# Patient Record
Sex: Female | Born: 1965 | Race: Black or African American | Hispanic: No | Marital: Single | Smoking: Never smoker
Health system: Southern US, Community
[De-identification: ages and names within clinical notes are randomized; demographics above are authoritative.]

## PROBLEM LIST (undated history)

## (undated) DIAGNOSIS — N3289 Other specified disorders of bladder: Secondary | ICD-10-CM

## (undated) DIAGNOSIS — D219 Benign neoplasm of connective and other soft tissue, unspecified: Secondary | ICD-10-CM

## (undated) DIAGNOSIS — K219 Gastro-esophageal reflux disease without esophagitis: Secondary | ICD-10-CM

## (undated) DIAGNOSIS — R350 Frequency of micturition: Secondary | ICD-10-CM

## (undated) DIAGNOSIS — N946 Dysmenorrhea, unspecified: Secondary | ICD-10-CM

## (undated) DIAGNOSIS — B379 Candidiasis, unspecified: Secondary | ICD-10-CM

## (undated) DIAGNOSIS — R351 Nocturia: Secondary | ICD-10-CM

## (undated) DIAGNOSIS — N920 Excessive and frequent menstruation with regular cycle: Secondary | ICD-10-CM

## (undated) DIAGNOSIS — Z8619 Personal history of other infectious and parasitic diseases: Secondary | ICD-10-CM

## (undated) DIAGNOSIS — Z87898 Personal history of other specified conditions: Secondary | ICD-10-CM

## (undated) DIAGNOSIS — N841 Polyp of cervix uteri: Secondary | ICD-10-CM

## (undated) DIAGNOSIS — F419 Anxiety disorder, unspecified: Secondary | ICD-10-CM

## (undated) HISTORY — DX: Personal history of other infectious and parasitic diseases: Z86.19

## (undated) HISTORY — DX: Personal history of other specified conditions: Z87.898

## (undated) HISTORY — DX: Frequency of micturition: R35.0

## (undated) HISTORY — DX: Candidiasis, unspecified: B37.9

## (undated) HISTORY — DX: Excessive and frequent menstruation with regular cycle: N92.0

## (undated) HISTORY — DX: Nocturia: R35.1

## (undated) HISTORY — DX: Other specified disorders of bladder: N32.89

## (undated) HISTORY — DX: Anxiety disorder, unspecified: F41.9

## (undated) HISTORY — DX: Benign neoplasm of connective and other soft tissue, unspecified: D21.9

## (undated) HISTORY — DX: Polyp of cervix uteri: N84.1

## (undated) HISTORY — DX: Dysmenorrhea, unspecified: N94.6

## (undated) SURGERY — Surgical Case
Anesthesia: *Unknown

---

## 2007-01-08 ENCOUNTER — Encounter: Admission: RE | Admit: 2007-01-08 | Discharge: 2007-01-08 | Payer: Self-pay | Admitting: Obstetrics and Gynecology

## 2008-04-12 ENCOUNTER — Encounter: Admission: RE | Admit: 2008-04-12 | Discharge: 2008-04-12 | Payer: Self-pay | Admitting: Obstetrics and Gynecology

## 2009-08-26 ENCOUNTER — Encounter: Admission: RE | Admit: 2009-08-26 | Discharge: 2009-08-26 | Payer: Self-pay | Admitting: Obstetrics and Gynecology

## 2010-08-29 ENCOUNTER — Encounter: Admission: RE | Admit: 2010-08-29 | Discharge: 2010-08-29 | Payer: Self-pay | Admitting: Obstetrics and Gynecology

## 2011-01-08 ENCOUNTER — Encounter: Payer: Self-pay | Admitting: Internal Medicine

## 2011-09-13 ENCOUNTER — Other Ambulatory Visit: Payer: Self-pay | Admitting: Obstetrics and Gynecology

## 2011-09-13 DIAGNOSIS — Z1231 Encounter for screening mammogram for malignant neoplasm of breast: Secondary | ICD-10-CM

## 2011-09-26 ENCOUNTER — Ambulatory Visit
Admission: RE | Admit: 2011-09-26 | Discharge: 2011-09-26 | Disposition: A | Discharge status: Home / Self Care | Payer: BC Managed Care – PPO | Source: Ambulatory Visit | Attending: Obstetrics and Gynecology | Admitting: Obstetrics and Gynecology

## 2011-09-26 DIAGNOSIS — Z1231 Encounter for screening mammogram for malignant neoplasm of breast: Secondary | ICD-10-CM

## 2012-05-15 ENCOUNTER — Telehealth: Payer: Self-pay | Admitting: Obstetrics and Gynecology

## 2012-05-15 NOTE — Telephone Encounter (Signed)
Triage/pt last seen on 10/04/2011

## 2012-05-16 ENCOUNTER — Telehealth: Payer: Self-pay

## 2012-05-16 NOTE — Telephone Encounter (Signed)
Pt was called back today and stated that she was told by Dr.Stringer the last time she spoke to him 09/2011  to call the office once she decided what she wanted to do as far as contraception. Pt states she does want to start Birth Control pills as well as PNV's she states that her co-workers take PNVs as well but she wants to check w/ Dr.Stringer first. [facility] CMA

## 2012-05-23 ENCOUNTER — Telehealth: Payer: Self-pay | Admitting: Obstetrics and Gynecology

## 2012-05-23 NOTE — Telephone Encounter (Signed)
Pt was called and appt has been scheduled for 06-09-12 @ 11am pt voiced understanding.

## 2012-06-09 ENCOUNTER — Encounter: Payer: Self-pay | Admitting: Obstetrics and Gynecology

## 2012-06-09 ENCOUNTER — Ambulatory Visit (INDEPENDENT_AMBULATORY_CARE_PROVIDER_SITE_OTHER): Payer: BC Managed Care – PPO | Admitting: Obstetrics and Gynecology

## 2012-06-09 VITALS — BP 108/72 | Resp 14 | Ht 65.5 in | Wt 138.0 lb

## 2012-06-09 DIAGNOSIS — Z309 Encounter for contraceptive management, unspecified: Secondary | ICD-10-CM

## 2012-06-09 DIAGNOSIS — R748 Abnormal levels of other serum enzymes: Secondary | ICD-10-CM | POA: Insufficient documentation

## 2012-06-09 DIAGNOSIS — N920 Excessive and frequent menstruation with regular cycle: Secondary | ICD-10-CM | POA: Insufficient documentation

## 2012-06-09 DIAGNOSIS — D219 Benign neoplasm of connective and other soft tissue, unspecified: Secondary | ICD-10-CM | POA: Insufficient documentation

## 2012-06-09 DIAGNOSIS — IMO0001 Reserved for inherently not codable concepts without codable children: Secondary | ICD-10-CM

## 2012-06-09 DIAGNOSIS — N3289 Other specified disorders of bladder: Secondary | ICD-10-CM | POA: Insufficient documentation

## 2012-06-09 DIAGNOSIS — R350 Frequency of micturition: Secondary | ICD-10-CM | POA: Insufficient documentation

## 2012-06-09 DIAGNOSIS — N946 Dysmenorrhea, unspecified: Secondary | ICD-10-CM

## 2012-06-09 NOTE — Progress Notes (Signed)
HISTORY OF PRESENT ILLNESS  [name] is a 46 y.o. year old female,G1P1, who presents for a problem visit. The patient has a known history of fibroids and menorrhagia.  She had a negative endometrial biopsy in 2010. She has declined other treatment options.  Subjective:  The patient complains of heavy bleeding.  She wants medication to help her cycles.  She was to consider birth control pills.  Objective:  BP 108/72  Resp 14  Ht 5' 5.5" (1.664 m)  Wt 138 lb (62.596 kg)  BMI 22.61 kg/m2  LMP 05/30/2012   General: alert, cooperative and no distress  exam declined  Assessment:  Menorrhagia Fibroid Endometrial polyps Elevated liver enzymes Unstable bladder  Plan:  We discussed our management options again.  Risk and benefits were discussed.  The patient seems most interested in a Nexplanon.  She will read the information provided and call us if she wants to schedule.  Return to office prn if symptoms worsen or fail to improve.   Leonard Schwartz M.D.  06/09/2012 8:41 PM

## 2012-09-17 ENCOUNTER — Other Ambulatory Visit: Payer: Self-pay | Admitting: Obstetrics and Gynecology

## 2012-09-17 DIAGNOSIS — Z1231 Encounter for screening mammogram for malignant neoplasm of breast: Secondary | ICD-10-CM

## 2012-10-16 ENCOUNTER — Ambulatory Visit
Admission: RE | Admit: 2012-10-16 | Discharge: 2012-10-16 | Disposition: A | Discharge status: Home / Self Care | Payer: BC Managed Care – PPO | Source: Ambulatory Visit | Attending: Obstetrics and Gynecology | Admitting: Obstetrics and Gynecology

## 2012-10-16 DIAGNOSIS — Z1231 Encounter for screening mammogram for malignant neoplasm of breast: Secondary | ICD-10-CM

## 2012-10-20 ENCOUNTER — Encounter: Payer: Self-pay | Admitting: Obstetrics and Gynecology

## 2012-10-21 ENCOUNTER — Ambulatory Visit (INDEPENDENT_AMBULATORY_CARE_PROVIDER_SITE_OTHER): Payer: BC Managed Care – PPO | Admitting: Obstetrics and Gynecology

## 2012-10-21 ENCOUNTER — Encounter: Payer: Self-pay | Admitting: Obstetrics and Gynecology

## 2012-10-21 VITALS — BP 122/76 | HR 88 | Ht 66.0 in | Wt 141.0 lb

## 2012-10-21 DIAGNOSIS — Z124 Encounter for screening for malignant neoplasm of cervix: Secondary | ICD-10-CM

## 2012-10-21 DIAGNOSIS — R350 Frequency of micturition: Secondary | ICD-10-CM

## 2012-10-21 DIAGNOSIS — Z01419 Encounter for gynecological examination (general) (routine) without abnormal findings: Secondary | ICD-10-CM

## 2012-10-21 LAB — POCT URINALYSIS DIPSTICK
Bilirubin, UA: NEGATIVE
Leukocytes, UA: NEGATIVE
Protein, UA: NEGATIVE
Spec Grav, UA: 1.025

## 2012-10-21 NOTE — Progress Notes (Signed)
Subjective:    Sheryl [name] is a 46 y.o. female, G1P1, who presents for an annual exam. The patient has a known history of fibroids.  She complains of menorrhagia.  She also has elevated liver enzymes, arthritis, and bladder instability.  She has been referred to a specialist for all the above.   History   Social History  . Marital Status: Single    Spouse Name: N/A    Number of Children: N/A  . Years of Education: N/A   Social History Main Topics  . Smoking status: Never Smoker   . Smokeless tobacco: Never Used  . Alcohol Use: Yes  . Drug Use: No  . Sexually Active: None   Other Topics Concern  . None   Social History Narrative  . None    Menstrual cycle:   LMP: Patient's last menstrual period was 10/10/2012.           Cycle: heavy most months.  The following portions of the patient's history were reviewed and updated as appropriate: allergies, current medications, past family history, past medical history, past social history, past surgical history and problem list.  Review of Systems Pertinent items are noted in HPI. Breast:Negative for breast lump,nipple discharge or nipple retraction Gastrointestinal: Negative for abdominal pain, change in bowel habits or rectal bleeding Urinary:negative   Objective:    BP 122/76  Pulse 88  Ht 5\' 6"  (1.676 m)  Wt 141 lb (63.957 kg)  BMI 22.76 kg/m2  LMP 10/10/2012    Weight:  Wt Readings from Last 1 Encounters:  10/21/12 141 lb (63.957 kg)          BMI: Body mass index is 22.76 kg/(m^2).  General Appearance: Alert, appropriate appearance for age. No acute distress HEENT: Grossly normal Neck / Thyroid: Supple, no masses, nodes or enlargement Lungs: clear to auscultation bilaterally Back: No CVA tenderness Breast Exam: No masses or nodes.No dimpling, nipple retraction or discharge. Cardiovascular: Regular rate and rhythm. S1, S2, no murmur Gastrointestinal: Soft, non-tender, no masses or  organomegaly  ++++++++++++++++++++++++++++++++++++++++++++++++++++++++  Pelvic Exam: External genitalia: normal general appearance Vaginal: normal without tenderness, induration or masses and relaxation noted, weak pelvic muscles Cervix: normal appearance Adnexa: normal bimanual exam Uterus: 12 week size, firm Rectovaginal: normal rectal, no masses  ++++++++++++++++++++++++++++++++++++++++++++++++++++++++  Lymphatic Exam: Non-palpable nodes in neck, clavicular, axillary, or inguinal regions Neurologic: Normal speech, no tremor  Psychiatric: Alert and oriented, appropriate affect.   Assessment:    fibroid uterus   Menorrhagia  Elevated liver enzymes  Arthritis  Bladder instability  Overweight or obese: No   Pelvic relaxation: Yes   Plan:    mammogram pap smear return annually or prn Contraception:abstinence   The patient will call in one month to discuss possible NovaSure ablation.  She is scheduled for liver biopsy and further evaluation.  The patient is scheduled to see a specialist about her bladder instability.  STD screen request: No   The updated Pap smear screening guidelines were discussed with the patient. The patient requested that I obtain a Pap smear: Yes.  Kegel exercises discussed: Yes.  Proper diet and regular exercise were reviewed.  Annual mammograms recommended starting at age 66. Proper breast care was discussed.  Screening colonoscopy is recommended beginning at age 74.  Regular health maintenance was reviewed.  Sleep hygiene was discussed.  Adequate calcium and vitamin D intake was emphasized.  Leonard Schwartz M.D.    Regular Periods: yes Mammogram: yes  Monthly Breast Ex.: yes Exercise: yes sometimes  Tetanus < 10 years: yes Seatbelts: yes  NI. Bladder Functn.: no sometimes frequency Abuse at home: no  Daily BM's: no Stressful Work: yes  Healthy Diet: yes Sigmoid-Colonoscopy: n/a  Calcium: no Medical problems this  year: arthritis, anemic, bladder problems, liver enzymes elevated, started seeing GI dr   LAST PAP: 06/13/09, pap due this yr per last note  Contraception: none  Mammogram:  10/16/12  PCP:  Dr. Dorothyann Peng  PMH: problems listed above  [facility]: unchanged  Last Bone Scan: n/a

## 2012-10-21 NOTE — Patient Instructions (Signed)

## 2012-10-23 LAB — PAP IG W/ RFLX HPV ASCU

## 2013-10-07 ENCOUNTER — Encounter (HOSPITAL_COMMUNITY): Payer: Self-pay | Admitting: Pharmacist

## 2013-10-12 ENCOUNTER — Other Ambulatory Visit: Payer: Self-pay | Admitting: Obstetrics and Gynecology

## 2013-10-13 NOTE — Patient Instructions (Addendum)
Your procedure is scheduled on: 10/28/2013  Enter through the Main Entrance of [facility] at: 7AM  Pick up the phone at the desk and dial [phone].  Call this number if you have problems the morning of surgery: [phone].  Remember: Do NOT eat food: AFTER MIDNIGHT  Do NOT drink clear liquids after:  AFTER MIDNIGHT  Take these medicines the morning of surgery with a SIP OF WATER: NONE  Do NOT wear jewelry (body piercing), make-up, or nail polish. Do NOT wear lotions, powders, or perfumes.  You may wear deoderant. Do NOT shave for 48 hours prior to surgery. Do NOT bring valuables to the hospital. Contacts, dentures, or bridgework may not be worn into surgery. Leave suitcase in car.  After surgery it may be brought to your room.  For patients admitted to the hospital, checkout time is 11:00 AM the day of discharge.

## 2013-10-14 ENCOUNTER — Encounter (HOSPITAL_COMMUNITY)
Admission: RE | Admit: 2013-10-14 | Discharge: 2013-10-14 | Disposition: A | Discharge status: Home / Self Care | Payer: BC Managed Care – PPO | Source: Ambulatory Visit | Attending: Obstetrics and Gynecology | Admitting: Obstetrics and Gynecology

## 2013-10-14 ENCOUNTER — Encounter (HOSPITAL_COMMUNITY): Payer: Self-pay

## 2013-10-14 ENCOUNTER — Encounter (INDEPENDENT_AMBULATORY_CARE_PROVIDER_SITE_OTHER): Payer: Self-pay

## 2013-10-14 DIAGNOSIS — Z01818 Encounter for other preprocedural examination: Secondary | ICD-10-CM | POA: Insufficient documentation

## 2013-10-14 DIAGNOSIS — Z01812 Encounter for preprocedural laboratory examination: Secondary | ICD-10-CM | POA: Insufficient documentation

## 2013-10-14 HISTORY — DX: Gastro-esophageal reflux disease without esophagitis: K21.9

## 2013-10-14 LAB — BASIC METABOLIC PANEL
BUN: 8 mg/dL (ref 6–23)
Calcium: 9.2 mg/dL (ref 8.4–10.5)
Chloride: 103 mEq/L (ref 96–112)
GFR calc Af Amer: >90 mL/min (ref >90)
GFR calc non Af Amer: >90 mL/min (ref >90)
Sodium: 138 mEq/L (ref 135–145)

## 2013-10-14 LAB — CBC
Hemoglobin: 11.3 g/dL — ABNORMAL LOW (ref 12.0–15.0)
MCHC: 32.1 g/dL (ref 30.0–36.0)
MCV: 90.3 fL (ref 78.0–100.0)
RBC: 3.9 MIL/uL (ref 3.87–5.11)
RDW: 14.7 % (ref 11.5–15.5)

## 2013-10-19 ENCOUNTER — Ambulatory Visit
Admission: RE | Admit: 2013-10-19 | Discharge: 2013-10-19 | Disposition: A | Discharge status: Home / Self Care | Payer: BC Managed Care – PPO | Source: Ambulatory Visit

## 2013-10-19 ENCOUNTER — Other Ambulatory Visit: Payer: Self-pay

## 2013-10-19 DIAGNOSIS — Z1231 Encounter for screening mammogram for malignant neoplasm of breast: Secondary | ICD-10-CM

## 2013-10-26 NOTE — H&P (Signed)
  Admission History and Physical Exam for a Gynecology Patient  Sheryl [name] is a 47 y.o. female, G1P1, who presents for abdominal myomectomy. She has been followed at the [facility] Gynecology division of [facility]. The patient complains of menorrhagia, anemia, dysmenorrhea, and she has a known history of fibroids. Her hemoglobin was documented to be 11.5. An endometrial biopsy was benign. Her Pap smear was within normal limits. An ultrasound showed a 15.3 x 9.7 cm multi-fibroid uterus with the largest fibroid measuring 10 cm. The patient does not want a hysterectomy.  OB History   Grav Para Term Preterm Abortions TAB SAB Ect Mult Living   1 1        1       Past Medical History  Diagnosis Date  . Unstable bladder   . Fibroids   . Menorrhagia   . Urinary frequency   . Endocervical polyp   . Dysmenorrhea   . Yeast infection   . History of chicken pox   . History of mumps   . History of measles   . Nocturia   . History of measles, mumps, or rubella   . H/O varicella   . GERD (gastroesophageal reflux disease)     diet controlled    No prescriptions prior to admission    Past Surgical History  Procedure Laterality Date  . No past surgeries      Allergies  Allergen Reactions  . Milk-Related Compounds     Family History: family history includes Cancer in her mother; Diabetes in her father and paternal grandfather; Hypertension in her mother; Mental illness in her father.  Social History:  reports that she has never smoked. She has never used smokeless tobacco. She reports that she drinks alcohol. She reports that she does not use illicit drugs.  Review of systems: See HPI.  Admission Physical Exam:    There is no weight on file to calculate BMI.  There were no vitals taken for this visit.  HEENT:                 Within normal limits Chest:                   Clear Heart:                    Regular rate and  rhythm Breasts:                No masses, skin changes, bleeding, or discharge present Abdomen:             Nontender, pelvic mass present Extremities:          Grossly normal Neurologic exam: Grossly normal  Pelvic exam:  External genitalia: normal general appearance Vaginal: normal without tenderness, induration or masses Cervix: normal appearance Adnexa: normal bimanual exam Uterus: 16 week size, irregular  Assessment:  16 week size multi-fibroid uterus  Anemia  Menorrhagia  Dysmenorrhea  Gastroesophageal reflux disease  Plan:  The patient will undergo an abdominal myomectomy. She understands the indications for her surgical procedure as well as the alternative treatment options. She accepts the risk of, but not limited to, anesthetic complications, bleeding, infections, and possible damage to the surrounding organs.   Janine Limbo 10/26/2013

## 2013-10-28 ENCOUNTER — Inpatient Hospital Stay (HOSPITAL_COMMUNITY)
Admission: RE | Admit: 2013-10-28 | Discharge: 2013-10-30 | DRG: 743 | Disposition: A | Discharge status: Home / Self Care | Payer: BC Managed Care – PPO | Source: Ambulatory Visit | Attending: Obstetrics and Gynecology | Admitting: Obstetrics and Gynecology

## 2013-10-28 ENCOUNTER — Encounter (HOSPITAL_COMMUNITY): Payer: Self-pay | Admitting: Registered Nurse

## 2013-10-28 ENCOUNTER — Encounter (HOSPITAL_COMMUNITY): Payer: BC Managed Care – PPO | Admitting: Anesthesiology

## 2013-10-28 ENCOUNTER — Inpatient Hospital Stay (HOSPITAL_COMMUNITY): Payer: BC Managed Care – PPO | Admitting: Anesthesiology

## 2013-10-28 ENCOUNTER — Encounter (HOSPITAL_COMMUNITY)
Admission: RE | Disposition: A | Discharge status: Home / Self Care | Payer: Self-pay | Source: Ambulatory Visit | Attending: Obstetrics and Gynecology

## 2013-10-28 DIAGNOSIS — Z9889 Other specified postprocedural states: Secondary | ICD-10-CM

## 2013-10-28 DIAGNOSIS — N946 Dysmenorrhea, unspecified: Secondary | ICD-10-CM | POA: Diagnosis present

## 2013-10-28 DIAGNOSIS — D259 Leiomyoma of uterus, unspecified: Principal | ICD-10-CM | POA: Diagnosis present

## 2013-10-28 DIAGNOSIS — K219 Gastro-esophageal reflux disease without esophagitis: Secondary | ICD-10-CM | POA: Diagnosis present

## 2013-10-28 DIAGNOSIS — N920 Excessive and frequent menstruation with regular cycle: Secondary | ICD-10-CM | POA: Diagnosis present

## 2013-10-28 DIAGNOSIS — N8000 Endometriosis of the uterus, unspecified: Secondary | ICD-10-CM | POA: Diagnosis present

## 2013-10-28 DIAGNOSIS — D649 Anemia, unspecified: Secondary | ICD-10-CM | POA: Diagnosis present

## 2013-10-28 HISTORY — PX: MYOMECTOMY: SHX85

## 2013-10-28 LAB — PREGNANCY, URINE: Preg Test, Ur: NEGATIVE

## 2013-10-28 LAB — TYPE AND SCREEN
ABO/RH(D): O POS
Antibody Screen: NEGATIVE

## 2013-10-28 SURGERY — MYOMECTOMY, ABDOMINAL APPROACH
Anesthesia: General | Site: Abdomen | Wound class: Clean Contaminated

## 2013-10-28 MED ORDER — ONDANSETRON HCL 4 MG/2ML IJ SOLN
INTRAMUSCULAR | Status: DC | PRN
  Administered 2013-10-28: 4 mg via INTRAVENOUS

## 2013-10-28 MED ORDER — VASOPRESSIN 20 UNIT/ML IJ SOLN
INTRAMUSCULAR | Status: AC
  Filled 2013-10-28: qty 1

## 2013-10-28 MED ORDER — LIDOCAINE HCL (CARDIAC) 20 MG/ML IV SOLN
INTRAVENOUS | Status: DC | PRN
  Administered 2013-10-28: 50 mg via INTRAVENOUS

## 2013-10-28 MED ORDER — 0.9 % SODIUM CHLORIDE (POUR BTL) OPTIME
TOPICAL | Status: DC | PRN
  Administered 2013-10-28: 1000 mL

## 2013-10-28 MED ORDER — KETOROLAC TROMETHAMINE 30 MG/ML IJ SOLN
INTRAMUSCULAR | Status: AC
  Filled 2013-10-28: qty 2

## 2013-10-28 MED ORDER — BUPIVACAINE-EPINEPHRINE 0.5% -1:200000 IJ SOLN
INTRAMUSCULAR | Status: DC | PRN
  Administered 2013-10-28: 10 mL

## 2013-10-28 MED ORDER — MIDAZOLAM HCL 5 MG/5ML IJ SOLN
INTRAMUSCULAR | Status: DC | PRN
  Administered 2013-10-28: 2 mg via INTRAVENOUS

## 2013-10-28 MED ORDER — CEFAZOLIN SODIUM-DEXTROSE 2-3 GM-% IV SOLR
INTRAVENOUS | Status: AC
  Filled 2013-10-28: qty 50

## 2013-10-28 MED ORDER — PROPOFOL 10 MG/ML IV EMUL
INTRAVENOUS | Status: AC
  Filled 2013-10-28: qty 20

## 2013-10-28 MED ORDER — NEOSTIGMINE METHYLSULFATE 1 MG/ML IJ SOLN
INTRAMUSCULAR | Status: DC | PRN
  Administered 2013-10-28: 4 mg via INTRAVENOUS

## 2013-10-28 MED ORDER — POLYSACCHARIDE IRON COMPLEX 150 MG PO CAPS
150.0000 mg | ORAL_CAPSULE | Freq: Every day | ORAL | Status: DC
  Administered 2013-10-28 – 2013-10-30 (×3): 150 mg via ORAL
  Filled 2013-10-28 (×3): qty 1

## 2013-10-28 MED ORDER — KETOROLAC TROMETHAMINE 30 MG/ML IJ SOLN
INTRAMUSCULAR | Status: DC | PRN
  Administered 2013-10-28: 30 mg via INTRAVENOUS
  Administered 2013-10-28: 30 mg via INTRAMUSCULAR

## 2013-10-28 MED ORDER — HYDROMORPHONE 0.3 MG/ML IV SOLN
INTRAVENOUS | Status: DC
  Administered 2013-10-28: 2 mL via INTRAVENOUS
  Administered 2013-10-28: 12:00:00 via INTRAVENOUS
  Administered 2013-10-28: 0.6 mg via INTRAVENOUS
  Administered 2013-10-28: 5 mL via INTRAVENOUS
  Administered 2013-10-29: 0.6 mg via INTRAVENOUS
  Administered 2013-10-29: 0.3 mg via INTRAVENOUS
  Filled 2013-10-28: qty 25

## 2013-10-28 MED ORDER — LIDOCAINE HCL (CARDIAC) 20 MG/ML IV SOLN
INTRAVENOUS | Status: AC
  Filled 2013-10-28: qty 5

## 2013-10-28 MED ORDER — ONDANSETRON HCL 4 MG/2ML IJ SOLN: 4.0000 mg | Freq: Four times a day (QID) | INTRAMUSCULAR | Status: DC | PRN

## 2013-10-28 MED ORDER — ROCURONIUM BROMIDE 100 MG/10ML IV SOLN
INTRAVENOUS | Status: AC
  Filled 2013-10-28: qty 1

## 2013-10-28 MED ORDER — PROMETHAZINE HCL 25 MG PO TABS: 12.5000 mg | ORAL_TABLET | Freq: Four times a day (QID) | ORAL | Status: DC | PRN

## 2013-10-28 MED ORDER — ACETAMINOPHEN 160 MG/5ML PO SOLN
ORAL | Status: AC
  Filled 2013-10-28: qty 40.6

## 2013-10-28 MED ORDER — HYDROMORPHONE HCL PF 1 MG/ML IJ SOLN
0.2500 mg | INTRAMUSCULAR | Status: DC | PRN
  Administered 2013-10-28 (×2): 0.5 mg via INTRAVENOUS

## 2013-10-28 MED ORDER — LACTATED RINGERS IV SOLN
INTRAVENOUS | Status: DC
  Administered 2013-10-28 (×3): via INTRAVENOUS

## 2013-10-28 MED ORDER — MEPERIDINE HCL 25 MG/ML IJ SOLN: 6.2500 mg | INTRAMUSCULAR | Status: DC | PRN

## 2013-10-28 MED ORDER — ROCURONIUM BROMIDE 100 MG/10ML IV SOLN
INTRAVENOUS | Status: DC | PRN
  Administered 2013-10-28: 60 mg via INTRAVENOUS

## 2013-10-28 MED ORDER — SODIUM CHLORIDE 0.9 % IJ SOLN
INTRAMUSCULAR | Status: AC
  Filled 2013-10-28: qty 100

## 2013-10-28 MED ORDER — GLYCOPYRROLATE 0.2 MG/ML IJ SOLN
INTRAMUSCULAR | Status: AC
  Filled 2013-10-28: qty 2

## 2013-10-28 MED ORDER — HEPARIN SODIUM (PORCINE) 5000 UNIT/ML IJ SOLN
INTRAMUSCULAR | Status: AC
  Filled 2013-10-28: qty 1

## 2013-10-28 MED ORDER — SODIUM CHLORIDE 0.9 % IJ SOLN: 9.0000 mL | INTRAMUSCULAR | Status: DC | PRN

## 2013-10-28 MED ORDER — FENTANYL CITRATE 0.05 MG/ML IJ SOLN
INTRAMUSCULAR | Status: DC | PRN
  Administered 2013-10-28: 150 ug via INTRAVENOUS
  Administered 2013-10-28 (×2): 50 ug via INTRAVENOUS

## 2013-10-28 MED ORDER — KETOROLAC TROMETHAMINE 30 MG/ML IJ SOLN: 15.0000 mg | Freq: Once | INTRAMUSCULAR | Status: DC | PRN

## 2013-10-28 MED ORDER — ONDANSETRON HCL 4 MG PO TABS: 4.0000 mg | ORAL_TABLET | Freq: Three times a day (TID) | ORAL | Status: DC | PRN

## 2013-10-28 MED ORDER — DIPHENHYDRAMINE HCL 12.5 MG/5ML PO ELIX: 12.5000 mg | ORAL_SOLUTION | Freq: Four times a day (QID) | ORAL | Status: DC | PRN

## 2013-10-28 MED ORDER — GLYCOPYRROLATE 0.2 MG/ML IJ SOLN
INTRAMUSCULAR | Status: AC
  Filled 2013-10-28: qty 1

## 2013-10-28 MED ORDER — MENTHOL 3 MG MT LOZG: 1.0000 | LOZENGE | OROMUCOSAL | Status: DC | PRN

## 2013-10-28 MED ORDER — NEOSTIGMINE METHYLSULFATE 1 MG/ML IJ SOLN
INTRAMUSCULAR | Status: AC
  Filled 2013-10-28: qty 1

## 2013-10-28 MED ORDER — DIPHENHYDRAMINE HCL 50 MG/ML IJ SOLN: 12.5000 mg | Freq: Four times a day (QID) | INTRAMUSCULAR | Status: DC | PRN

## 2013-10-28 MED ORDER — CEFAZOLIN SODIUM-DEXTROSE 2-3 GM-% IV SOLR
2.0000 g | INTRAVENOUS | Status: AC
  Administered 2013-10-28: 2 g via INTRAVENOUS

## 2013-10-28 MED ORDER — ONDANSETRON HCL 4 MG/2ML IJ SOLN
INTRAMUSCULAR | Status: AC
  Filled 2013-10-28: qty 2

## 2013-10-28 MED ORDER — PROPOFOL 10 MG/ML IV BOLUS
INTRAVENOUS | Status: DC | PRN
  Administered 2013-10-28: 200 mg via INTRAVENOUS

## 2013-10-28 MED ORDER — OXYCODONE-ACETAMINOPHEN 5-325 MG PO TABS
1.0000 | ORAL_TABLET | ORAL | Status: DC | PRN
  Administered 2013-10-29 (×2): 2 via ORAL
  Administered 2013-10-30 (×3): 1 via ORAL
  Filled 2013-10-28 (×2): qty 1
  Filled 2013-10-28 (×2): qty 2
  Filled 2013-10-28: qty 1

## 2013-10-28 MED ORDER — FENTANYL CITRATE 0.05 MG/ML IJ SOLN
INTRAMUSCULAR | Status: AC
  Filled 2013-10-28: qty 5

## 2013-10-28 MED ORDER — NALOXONE HCL 0.4 MG/ML IJ SOLN: 0.4000 mg | INTRAMUSCULAR | Status: DC | PRN

## 2013-10-28 MED ORDER — VASOPRESSIN 20 UNIT/ML IJ SOLN
INTRAVENOUS | Status: DC | PRN
  Administered 2013-10-28: 10:00:00 via INTRAMUSCULAR

## 2013-10-28 MED ORDER — PROMETHAZINE HCL 25 MG/ML IJ SOLN: 12.5000 mg | Freq: Four times a day (QID) | INTRAMUSCULAR | Status: DC | PRN

## 2013-10-28 MED ORDER — HYDROMORPHONE HCL PF 1 MG/ML IJ SOLN
INTRAMUSCULAR | Status: AC
  Administered 2013-10-28: 0.5 mg via INTRAVENOUS
  Filled 2013-10-28: qty 1

## 2013-10-28 MED ORDER — ACETAMINOPHEN 160 MG/5ML PO SOLN
975.0000 mg | Freq: Once | ORAL | Status: AC
  Administered 2013-10-28: 975 mg via ORAL

## 2013-10-28 MED ORDER — DEXAMETHASONE SODIUM PHOSPHATE 10 MG/ML IJ SOLN
INTRAMUSCULAR | Status: AC
  Filled 2013-10-28: qty 1

## 2013-10-28 MED ORDER — KETOROLAC TROMETHAMINE 30 MG/ML IJ SOLN
30.0000 mg | Freq: Four times a day (QID) | INTRAMUSCULAR | Status: DC
  Administered 2013-10-28 – 2013-10-29 (×5): 30 mg via INTRAVENOUS
  Filled 2013-10-28 (×5): qty 1

## 2013-10-28 MED ORDER — DEXAMETHASONE SODIUM PHOSPHATE 10 MG/ML IJ SOLN
INTRAMUSCULAR | Status: DC | PRN
  Administered 2013-10-28: 10 mg via INTRAVENOUS

## 2013-10-28 MED ORDER — BUPIVACAINE-EPINEPHRINE (PF) 0.5% -1:200000 IJ SOLN
INTRAMUSCULAR | Status: AC
  Filled 2013-10-28: qty 10

## 2013-10-28 MED ORDER — LACTATED RINGERS IV SOLN
INTRAVENOUS | Status: DC
  Administered 2013-10-28 – 2013-10-29 (×3): via INTRAVENOUS

## 2013-10-28 MED ORDER — MIDAZOLAM HCL 2 MG/2ML IJ SOLN
INTRAMUSCULAR | Status: AC
  Filled 2013-10-28: qty 2

## 2013-10-28 MED ORDER — METOCLOPRAMIDE HCL 5 MG/ML IJ SOLN: 10.0000 mg | Freq: Once | INTRAMUSCULAR | Status: DC | PRN

## 2013-10-28 MED ORDER — IBUPROFEN 600 MG PO TABS: 600.0000 mg | ORAL_TABLET | Freq: Four times a day (QID) | ORAL | Status: DC

## 2013-10-28 MED ORDER — GLYCOPYRROLATE 0.2 MG/ML IJ SOLN
INTRAMUSCULAR | Status: DC | PRN
  Administered 2013-10-28: 0.6 mg via INTRAVENOUS

## 2013-10-28 MED ORDER — INTEGRA PLUS PO CAPS: 1.0000 | ORAL_CAPSULE | Freq: Every day | ORAL | Status: DC

## 2013-10-28 SURGICAL SUPPLY — 50 items
BARRIER ADHS 3X4 INTERCEED (GAUZE/BANDAGES/DRESSINGS) ×2 IMPLANT
BRR ADH 4X3 ABS CNTRL BYND (GAUZE/BANDAGES/DRESSINGS) ×1
CANISTER SUCT 3000ML (MISCELLANEOUS) ×2 IMPLANT
CELLS DAT CNTRL 66122 CELL SVR (MISCELLANEOUS) ×1 IMPLANT
CHLORAPREP W/TINT 26ML (MISCELLANEOUS) ×2 IMPLANT
CLOTH BEACON ORANGE TIMEOUT ST (SAFETY) ×2 IMPLANT
CONT PATH 16OZ SNAP LID 3702 (MISCELLANEOUS) ×2 IMPLANT
DECANTER SPIKE VIAL GLASS SM (MISCELLANEOUS) ×6 IMPLANT
DRAIN JACKSON PRT FLT 7MM (DRAIN) IMPLANT
DRAPE CESAREAN BIRTH W POUCH (DRAPES) ×2 IMPLANT
DRESSING TELFA 8X3 (GAUZE/BANDAGES/DRESSINGS) ×2 IMPLANT
ELECT CAUTERY BLADE 6.4 (BLADE) IMPLANT
EVACUATOR SILICONE 100CC (DRAIN) IMPLANT
GAUZE SPONGE 4X4 16PLY XRAY LF (GAUZE/BANDAGES/DRESSINGS) ×2 IMPLANT
GLOVE BIOGEL PI IND STRL 8.5 (GLOVE) ×1 IMPLANT
GLOVE BIOGEL PI INDICATOR 8.5 (GLOVE) ×1
GLOVE ECLIPSE 8.0 STRL XLNG CF (GLOVE) ×4 IMPLANT
GOWN PREVENTION PLUS LG XLONG (DISPOSABLE) ×4 IMPLANT
GOWN PREVENTION PLUS XXLARGE (GOWN DISPOSABLE) ×2 IMPLANT
NEEDLE HYPO 25X1 1.5 SAFETY (NEEDLE) ×2 IMPLANT
NS IRRIG 1000ML POUR BTL (IV SOLUTION) ×2 IMPLANT
PACK ABDOMINAL GYN (CUSTOM PROCEDURE TRAY) ×2 IMPLANT
PAD ABD 7.5X8 STRL (GAUZE/BANDAGES/DRESSINGS) ×2 IMPLANT
PAD OB MATERNITY 4.3X12.25 (PERSONAL CARE ITEMS) ×2 IMPLANT
RINGERS IRRIG 1000ML POUR BTL (IV SOLUTION) ×2 IMPLANT
RTRCTR WOUND ALEXIS 18CM MED (MISCELLANEOUS) ×2
SPONGE GAUZE 4X4 12PLY (GAUZE/BANDAGES/DRESSINGS) ×2 IMPLANT
STAPLER VISISTAT 35W (STAPLE) ×2 IMPLANT
SUT MNCRL AB 3-0 PS2 27 (SUTURE) IMPLANT
SUT PDS AB 0 CT 36 (SUTURE) IMPLANT
SUT VIC AB 0 CT1 18XCR BRD8 (SUTURE) ×2 IMPLANT
SUT VIC AB 0 CT1 27 (SUTURE) ×4
SUT VIC AB 0 CT1 27XBRD ANBCTR (SUTURE) ×2 IMPLANT
SUT VIC AB 0 CT1 8-18 (SUTURE) ×4
SUT VIC AB 0 CTX 36 (SUTURE) ×2
SUT VIC AB 0 CTX36XBRD ANBCTRL (SUTURE) ×1 IMPLANT
SUT VIC AB 2-0 CT1 27 (SUTURE)
SUT VIC AB 2-0 CT1 TAPERPNT 27 (SUTURE) IMPLANT
SUT VIC AB 3-0 CT1 27 (SUTURE) ×2
SUT VIC AB 3-0 CT1 TAPERPNT 27 (SUTURE) ×1 IMPLANT
SUT VIC AB 3-0 SH 27 (SUTURE) ×6
SUT VIC AB 3-0 SH 27X BRD (SUTURE) ×3 IMPLANT
SUT VIC AB 4-0 SH 27 (SUTURE)
SUT VIC AB 4-0 SH 27XANBCTRL (SUTURE) IMPLANT
SUT VICRYL 0 TIES 12 18 (SUTURE) ×2 IMPLANT
SYR CONTROL 10ML LL (SYRINGE) ×2 IMPLANT
TAPE CLOTH SURG 4X10 WHT LF (GAUZE/BANDAGES/DRESSINGS) ×2 IMPLANT
TOWEL OR 17X24 6PK STRL BLUE (TOWEL DISPOSABLE) ×4 IMPLANT
TRAY FOLEY CATH 14FR (SET/KITS/TRAYS/PACK) ×2 IMPLANT
WATER STERILE IRR 1000ML POUR (IV SOLUTION) IMPLANT

## 2013-10-28 NOTE — Preoperative (Signed)
Beta Blockers   Reason not to administer Beta Blockers:Not Applicable 

## 2013-10-28 NOTE — Anesthesia Preprocedure Evaluation (Addendum)
Anesthesia Evaluation  Patient identified by MRN, date of birth, ID band Patient awake    Reviewed: Allergy & Precautions, H&P , NPO status , Patient's Chart, lab work & pertinent test results, reviewed documented beta blocker date and time   History of Anesthesia Complications Negative for: history of anesthetic complications  Airway Mallampati: II TM Distance: >3 FB Neck ROM: full    Dental no notable dental hx. (+) Teeth Intact   Pulmonary neg pulmonary ROS,  breath sounds clear to auscultation  Pulmonary exam normal       Cardiovascular Exercise Tolerance: Good negative cardio ROS  Rhythm:regular Rate:Normal     Neuro/Psych negative neurological ROS  negative psych ROS   GI/Hepatic Neg liver ROS, GERD- (diet-controlled)  ,  Endo/Other  negative endocrine ROS  Renal/GU negative Renal ROS Bladder dysfunction Female GU complaint     Musculoskeletal   Abdominal Normal abdominal exam  (+)   Peds  Hematology  (+) anemia ,   Anesthesia Other Findings   Reproductive/Obstetrics negative OB ROS                          Anesthesia Physical Anesthesia Plan  ASA: II  Anesthesia Plan: General ETT   Post-op Pain Management:    Induction:   Airway Management Planned:   Additional Equipment:   Intra-op Plan:   Post-operative Plan:   Informed Consent: I have reviewed the patients History and Physical, chart, labs and discussed the procedure including the risks, benefits and alternatives for the proposed anesthesia with the patient or authorized representative who has indicated his/her understanding and acceptance.   Dental Advisory Given  Plan Discussed with: CRNA and Surgeon  Anesthesia Plan Comments:         Anesthesia Quick Evaluation

## 2013-10-28 NOTE — H&P (Signed)
The patient was interviewed and examined today.  The previously documented history and physical examination was reviewed. There are no changes. The operative procedure was reviewed. The risks and benefits were outlined again. The specific risks include, but are not limited to, anesthetic complications, bleeding, infections, and possible damage to the surrounding organs. The patient's questions were answered.  We are ready to proceed as outlined. The likelihood of the patient achieving the goals of this procedure is very likely.   BP 141/91  Pulse 96  Temp(Src) 98.6 F (37 C) (Oral)  Resp 16  Ht 5\' 6"  (1.676 m)  Wt 152 lb (68.947 kg)  BMI 24.55 kg/m2  SpO2 100%  Results for orders placed during the hospital encounter of 10/28/13 (from the past 24 hour(s))  PREGNANCY, URINE     Status: None   Collection Time    10/28/13  7:00 AM      Result Value Range   Preg Test, Ur NEGATIVE  NEGATIVE  TYPE AND SCREEN     Status: None   Collection Time    10/28/13  7:10 AM      Result Value Range   ABO/RH(D) O POS     Antibody Screen NEG     Sample Expiration 10/31/2013       Leonard Schwartz, M.D.

## 2013-10-28 NOTE — Anesthesia Postprocedure Evaluation (Signed)
  Anesthesia Post-op Note  Patient: Sheryl [name]  Procedure(s) Performed: Procedure(s): MYOMECTOMY (N/A) Patient is awake and responsive. Pain and nausea are reasonably well controlled. Vital signs are stable and clinically acceptable. Oxygen saturation is clinically acceptable. There are no apparent anesthetic complications at this time. Patient is ready for discharge.

## 2013-10-28 NOTE — Transfer of Care (Signed)
Immediate Anesthesia Transfer of Care Note  Patient: Sheryl [name]  Procedure(s) Performed: Procedure(s): MYOMECTOMY (N/A)  Patient Location: PACU  Anesthesia Type:General  Level of Consciousness: sedated  Airway & Oxygen Therapy: Patient Spontanous Breathing and Patient connected to nasal cannula oxygen  Post-op Assessment: Report given to PACU RN  Post vital signs: Reviewed  Complications: No apparent anesthesia complications

## 2013-10-28 NOTE — Progress Notes (Signed)
Ur chart review completed.  

## 2013-10-28 NOTE — Progress Notes (Signed)
Day of Surgery Procedure(s) (LRB): MYOMECTOMY (N/A)  Day 0  Subjective: Patient reports tolerating PO.    Objective: I have reviewed patient's vital signs and intake and output.  General: alert and no distress Resp: clear to auscultation bilaterally Cardio: regular rate and rhythm, S1, S2 normal, no murmur, click, rub or gallop GI: soft and nontender Extremities: Homans sign is negative, no sign of DVT  Assessment: s/p Procedure(s): MYOMECTOMY: stable  Plan: Encourage ambulation Advance to PO medication  LOS: 0 days    Betzaida Cremeens V 10/28/2013, 6:41 PM

## 2013-10-28 NOTE — Anesthesia Postprocedure Evaluation (Signed)
Anesthesia Post Note  Patient: Sheryl [name]  Procedure(s) Performed: Procedure(s): MYOMECTOMY (N/A)  Anesthesia type: General  Patient location: [facility] Unit  Post pain: Pain level controlled  Post assessment: Post-op Vital signs reviewed  Last Vitals: BP 115/74  Pulse 81  Temp(Src) 36.6 C (Oral)  Resp 8  Ht 5\' 6"  (1.676 m)  Wt 152 lb (68.947 kg)  BMI 24.55 kg/m2  SpO2 98%  Post vital signs: Reviewed  Level of consciousness: awake  Complications: No apparent anesthesia complications

## 2013-10-28 NOTE — Op Note (Signed)
OPERATIVE NOTE  Sheryl [name]  DOB:    1966-10-23  MRN:    [id number]  CSN:    [id number]  Date of Surgery:  10/28/2013  Preoperative Diagnosis:  Menorrhagia  Anemia  Fibroid uterus  Dysmenorrhea  Postoperative Diagnosis:  Same  Probable adenomyosis  Procedure:  Multiple abdominal myomectomy  Surgeon:  Leonard Schwartz, M.D.  Assistant:  Henreitta Leber, PA-C  Anesthetic:  General  Disposition:  The patient presents with the above-mentioned diagnosis. She understands the indications for surgical procedure. She accepts the risk of, but not limited to, anesthetic complications, bleeding, infections, and possible damage to the surrounding organs.  Findings:  A total of 6 fibroids were removed. The largest fibroid measured approximately 8 cm in size. This largest mass may represent adenomyosis or a degenerating fibroid. The total weight was 314 g. The fallopian tubes and the ovaries appeared normal. No other pelvic abnormalities were appreciated. We did penetrate deeply into the myometrium. If a pregnancy should occur in the future, then I do recommend a cesarean delivery.  Procedure:  The patient was taken to the operating room where a general anesthetic was given. A timeout was performed confirming the appropriate procedure. The patient's abdomen was prepped with DuraPrep. Her perineum and vagina were prepped with Betadine. A Foley catheter was placed in the bladder. The patient was sterilely draped. The patient's lower abdomen was injected with 10 cc of half percent Marcaine with epinephrine. A low transverse incision was made in the lower abdomen and carried sharply through the subcutaneous tissue, the fascia, and the anterior peritoneum. The pelvis was explored with findings as mentioned above. The uterus was elevated into the operative field. The surface of the uterus was injected with a diluted solution of Pitressin and saline just above the largest  fibroid. Pictures were taken. The fibroid was removed from the myometrium using a combination of sharp and blunt dissection. This incision was at the fundus of the uterus. We did penetrate deeply into the myometrium. A second fibroid was removed from deep within this incision. The defect in the uterus was closed using deep sutures followed by a running superficial suture and the serosal surface of the uterus. Hemostasis was adequate. A pedunculated fibroid was present on the posterior surface of the uterus. The base of the fibroid was injected with Pitressin and saline. The fibroid was sharply removed. The serosal surface of the uterus was suture using 3-0 Vicryl. 3 small fibroids were removed from the posterior surface of the uterus. The serosal surface of the uterus was closed using 3-0 Vicryl. The pelvis was irrigated. Hemostasis was confirmed. Interceed was placed over the uterine incisions. All instruments and packs were removed from the abdominal cavity. The anterior peritoneum and the abdominal musculature were reapproximated in the midline using figure-of-eight sutures. The abdominal musculature and the fascia were irrigated. Hemostasis was adequate. The fascia was closed using a running suture followed by several interrupted sutures. The subcutaneous layer was closed using interrupted sutures. The skin was reapproximated using a subcuticular suture of 3-0 Monocryl. 0 Vicryl is the suture material used except where otherwise mentioned.  Sponge, needle, and instrument counts were correct on 2 occasions. The estimated blood loss for the procedure was 500 cc's. The patient tolerated her procedure well. She was awakened from her anesthetic without difficulty. She was transported to the recovery room in stable condition. She was noted to drain clear yellow urine. Pathology specimens included: 6 fibroids.   Leonard Schwartz, M.D.  10/28/2013

## 2013-10-29 ENCOUNTER — Encounter (HOSPITAL_COMMUNITY): Payer: Self-pay | Admitting: Obstetrics and Gynecology

## 2013-10-29 DIAGNOSIS — Z9889 Other specified postprocedural states: Secondary | ICD-10-CM

## 2013-10-29 LAB — CBC
HCT: 27.3 % — ABNORMAL LOW (ref 36.0–46.0)
Hemoglobin: 9.1 g/dL — ABNORMAL LOW (ref 12.0–15.0)
MCHC: 33.3 g/dL (ref 30.0–36.0)
RBC: 3 MIL/uL — ABNORMAL LOW (ref 3.87–5.11)

## 2013-10-29 MED ORDER — IBUPROFEN 600 MG PO TABS
600.0000 mg | ORAL_TABLET | Freq: Four times a day (QID) | ORAL | Status: DC
  Administered 2013-10-29 – 2013-10-30 (×3): 600 mg via ORAL
  Filled 2013-10-29 (×3): qty 1

## 2013-10-29 NOTE — Progress Notes (Signed)
PROGRESS NOTE  I have reviewed the patient's vital signs, labs, and notes. I have examined the patient. I agree with the previous note from the physician assistant.  Leonard Schwartz, M.D. 10/29/2013

## 2013-10-29 NOTE — Progress Notes (Signed)
Sheryl [name] is a36 y.o.  [id number]  Post Op Date # 1: Abdominal Myomectomy  Subjective: Patient is Doing well postoperatively. Patient has Pain is controlled with current analgesics. Medications being used: prescription NSAID's including Toradol and narcotic analgesics including Hydromorphone-PCA.., ambulated last evening without dizziness, tolerating liquids but hasn't voided yet nor passed flatus.  Objective: Vital signs in last 24 hours: Temp:  [97.3 F (36.3 C)-98.7 F (37.1 C)] 98.7 F (37.1 C) (11/13 0513) Pulse Rate:  [67-88] 86 (11/13 0520) Resp:  [8-18] 16 (11/13 0513) BP: (103-120)/(58-81) 116/81 mmHg (11/13 0520) SpO2:  [98 %-100 %] 98 % (11/13 0513)  Intake/Output from previous day: 11/12 0701 - 11/13 0700 In: 4691.1 [P.O.:340; I.V.:4351.1] Out: 2925 [Urine:2425] Intake/Output this shift:    Recent Labs Lab 10/29/13 0510  WBC 12.6*  HGB 9.1*  HCT 27.3*  PLT 212    No results found for this basename: NA, K, CL, CO2, BUN, CREATININE, CALCIUM, LABALBU, PROT, BILITOT, ALKPHOS, ALT, AST, GLUCOSE,  in the last 168 hours  EXAM: General: alert, cooperative and no distress Resp: clear to auscultation bilaterally Cardio: regular rate and rhythm, S1, S2 normal, no murmur, click, rub or gallop GI: Soft, bowel sounds present, dressing clean/dry/intact. Extremities: Homans sign is negative, no sign of DVT and no calf tenderness.   Assessment: s/p Procedure(s): MYOMECTOMY: stable, progressing well and anemia  Plan: Advance diet Encourage ambulation Advance to PO medication Routine care.  LOS: 1 day    Kelsy Polack, PA-C 10/29/2013 7:27 AM

## 2013-10-30 MED ORDER — INTEGRA PLUS PO CAPS: 1.0000 | ORAL_CAPSULE | Freq: Two times a day (BID) | ORAL | Status: DC

## 2013-10-30 MED ORDER — OXYCODONE-ACETAMINOPHEN 5-325 MG PO TABS: 1.0000 | ORAL_TABLET | ORAL | Status: DC | PRN

## 2013-10-30 MED ORDER — IBUPROFEN 600 MG PO TABS: ORAL_TABLET | ORAL | Status: AC

## 2013-10-30 MED ORDER — ONDANSETRON HCL 4 MG PO TABS: 4.0000 mg | ORAL_TABLET | Freq: Three times a day (TID) | ORAL | Status: DC | PRN

## 2013-10-30 NOTE — Progress Notes (Signed)
Sheryl [name] is a32 y.o.  [id number]  Post Op Date # 2: Abdominal Myomectomy  Subjective: Patient is Doing well postoperatively. Patient has Pain controlled with oral narcotic and NSAIDs. Tolerating regular diet, ambulating without difficulty and voiding without difficulty.  Objective: Vital signs in last 24 hours: Temp:  [98.1 F (36.7 C)-98.8 F (37.1 C)] 98.8 F (37.1 C) (11/14 0512) Pulse Rate:  [78-86] 78 (11/14 0512) Resp:  [16-18] 16 (11/14 0512) BP: (105-122)/(67-78) 122/78 mmHg (11/14 0512) SpO2:  [96 %-99 %] 98 % (11/14 0512)  Intake/Output from previous day: 11/13 0701 - 11/14 0700 In: 1080 [P.O.:1080] Out: 400 [Urine:400] Intake/Output this shift:    Recent Labs Lab 10/29/13 0510  WBC 12.6*  HGB 9.1*  HCT 27.3*  PLT 212    No results found for this basename: NA, K, CL, CO2, BUN, CREATININE, CALCIUM, LABALBU, PROT, BILITOT, ALKPHOS, ALT, AST, GLUCOSE,  in the last 168 hours  EXAM: General: alert, cooperative and no distress Resp: clear to auscultation bilaterally Cardio: regular rate and rhythm, S1, S2 normal, no murmur, click, rub or gallop GI: Soft, bowel sound present Extremities: Homans sign is negative, no sign of DVT and no calf tenderness Vaginal Bleeding: scant Incision: good wound edge approximation with no evidence of infection.   Assessment: s/p Procedure(s): MYOMECTOMY: stable, progressing well and anemia  Plan: Discharge home  LOS: 2 days    Sheryl Guitron, PA-C 10/30/2013 6:52 AM

## 2013-10-30 NOTE — Discharge Summary (Signed)
Physician Discharge Summary  Patient ID: Sheryl [name] MRN: [id number] DOB/AGE: [birth year] 47 y.o.  Admit date: 10/28/2013 Discharge date: 10/30/2013   Discharge Diagnoses:  Symptomatic Uterine Fibroids,  Menorrhagia,  Dysmenorrhea and Anemia Principal Problem:   S/P myomectomy   Operation: Abdominal Myomectomy   Discharged Condition:  Good   Hospital Course: On the date of admission the patient underwent the aforementioned procedure with the removal of #6 fibroids, the largest of which was 8 cm (degenerating fibroid vs adenomyosis), tolerating procedure well.  Post operative course was unremarkable with the patient tolerating a post operative hemoglobin of 9.1  (pre-operative hemoglobin, 11.3).  By post operative day #2 the patient had resumed bowel and bladder function and was  deemed ready for discharge home.  Disposition: Home to self care  Discharge Medications:    Medication List    STOP taking these medications       naproxen sodium 220 MG tablet  Commonly known as:  ANAPROX     prenatal multivitamin Tabs tablet      TAKE these medications       CALCARB 600/D 600-400 MG-UNIT per tablet  Generic drug:  Calcium Carbonate-Vitamin D  Take 1 tablet by mouth daily.     ibuprofen 600 MG tablet  Commonly known as:  ADVIL,MOTRIN  1 po pc q 6 hours x 5 days then prn-pain     ondansetron 4 MG tablet  Commonly known as:  ZOFRAN  Take 1 tablet (4 mg total) by mouth every 8 (eight) hours as needed for nausea or vomiting.     oxyCODONE-acetaminophen 5-325 MG per tablet  Commonly known as:  PERCOCET/ROXICET  Take 1-2 tablets by mouth every 4 (four) hours as needed for severe pain (moderate to severe pain (when tolerating fluids)).      ASK your doctor about these medications       acetaminophen 500 MG tablet  Commonly known as:  TYLENOL  Take 1,000 mg by mouth daily as needed (headache).     INTEGRA PLUS PO  Take 1 capsule by mouth daily.  Ask about: Which  instructions should I use?     INTEGRA PLUS Caps  Take 1 capsule by mouth 2 (two) times daily after a meal.  Ask about: Which instructions should I use?          Follow-up: Dr. Janine Limbo,  December 03, 2013 at 10:15 a.m.   SignedHenreitta Leber, PA-C 10/30/2013, 7:08 AM

## 2013-10-30 NOTE — Progress Notes (Signed)
PROGRESS NOTE  I have reviewed the patient's vital signs, labs, and notes. I have examined the patient. I agree with the previous note from the physician assistant.  Leonard Schwartz, M.D. 10/30/2013

## 2014-10-18 ENCOUNTER — Encounter (HOSPITAL_COMMUNITY): Payer: Self-pay | Admitting: Obstetrics and Gynecology

## 2014-11-01 ENCOUNTER — Other Ambulatory Visit: Payer: Self-pay

## 2014-11-01 DIAGNOSIS — Z1231 Encounter for screening mammogram for malignant neoplasm of breast: Secondary | ICD-10-CM

## 2014-11-23 ENCOUNTER — Ambulatory Visit
Admission: RE | Admit: 2014-11-23 | Discharge: 2014-11-23 | Disposition: A | Discharge status: Home / Self Care | Payer: BC Managed Care – PPO | Source: Ambulatory Visit

## 2014-11-23 DIAGNOSIS — Z1231 Encounter for screening mammogram for malignant neoplasm of breast: Secondary | ICD-10-CM

## 2016-01-04 ENCOUNTER — Other Ambulatory Visit: Payer: Self-pay

## 2016-01-04 DIAGNOSIS — Z1231 Encounter for screening mammogram for malignant neoplasm of breast: Secondary | ICD-10-CM

## 2016-01-16 ENCOUNTER — Ambulatory Visit
Admission: RE | Admit: 2016-01-16 | Discharge: 2016-01-16 | Disposition: A | Discharge status: Home / Self Care | Payer: BLUE CROSS/BLUE SHIELD | Source: Ambulatory Visit

## 2016-01-16 DIAGNOSIS — Z1231 Encounter for screening mammogram for malignant neoplasm of breast: Secondary | ICD-10-CM

## 2017-02-18 LAB — HM COLONOSCOPY

## 2017-02-19 ENCOUNTER — Other Ambulatory Visit: Payer: Self-pay | Admitting: Obstetrics and Gynecology

## 2017-02-19 DIAGNOSIS — Z1231 Encounter for screening mammogram for malignant neoplasm of breast: Secondary | ICD-10-CM

## 2017-02-20 ENCOUNTER — Ambulatory Visit
Admission: RE | Admit: 2017-02-20 | Discharge: 2017-02-20 | Disposition: A | Discharge status: Home / Self Care | Payer: BLUE CROSS/BLUE SHIELD | Source: Ambulatory Visit | Attending: Obstetrics and Gynecology | Admitting: Obstetrics and Gynecology

## 2017-02-20 DIAGNOSIS — Z1231 Encounter for screening mammogram for malignant neoplasm of breast: Secondary | ICD-10-CM

## 2017-03-05 ENCOUNTER — Other Ambulatory Visit: Payer: Self-pay | Admitting: Internal Medicine

## 2017-03-05 DIAGNOSIS — R7989 Other specified abnormal findings of blood chemistry: Secondary | ICD-10-CM

## 2017-03-05 DIAGNOSIS — R945 Abnormal results of liver function studies: Secondary | ICD-10-CM

## 2017-03-15 ENCOUNTER — Ambulatory Visit
Admission: RE | Admit: 2017-03-15 | Discharge: 2017-03-15 | Disposition: A | Discharge status: Home / Self Care | Payer: BLUE CROSS/BLUE SHIELD | Source: Ambulatory Visit | Attending: Internal Medicine | Admitting: Internal Medicine

## 2017-03-15 DIAGNOSIS — R7989 Other specified abnormal findings of blood chemistry: Secondary | ICD-10-CM

## 2017-03-15 DIAGNOSIS — R945 Abnormal results of liver function studies: Secondary | ICD-10-CM

## 2018-02-11 ENCOUNTER — Other Ambulatory Visit: Payer: Self-pay | Admitting: Obstetrics and Gynecology

## 2018-02-11 DIAGNOSIS — Z1231 Encounter for screening mammogram for malignant neoplasm of breast: Secondary | ICD-10-CM

## 2018-03-03 ENCOUNTER — Ambulatory Visit
Admission: RE | Admit: 2018-03-03 | Discharge: 2018-03-03 | Disposition: A | Discharge status: Home / Self Care | Payer: BLUE CROSS/BLUE SHIELD | Source: Ambulatory Visit | Attending: Obstetrics and Gynecology | Admitting: Obstetrics and Gynecology

## 2018-03-03 DIAGNOSIS — Z1231 Encounter for screening mammogram for malignant neoplasm of breast: Secondary | ICD-10-CM

## 2018-05-07 ENCOUNTER — Ambulatory Visit: Payer: BLUE CROSS/BLUE SHIELD | Admitting: Allergy

## 2018-05-07 ENCOUNTER — Encounter: Payer: Self-pay | Admitting: Allergy

## 2018-05-07 VITALS — BP 132/74 | HR 80 | Temp 98.7°F | Resp 18 | Ht 65.0 in | Wt 168.0 lb

## 2018-05-07 DIAGNOSIS — H1010 Acute atopic conjunctivitis, unspecified eye: Secondary | ICD-10-CM | POA: Diagnosis not present

## 2018-05-07 DIAGNOSIS — J309 Allergic rhinitis, unspecified: Secondary | ICD-10-CM

## 2018-05-07 DIAGNOSIS — T781XXD Other adverse food reactions, not elsewhere classified, subsequent encounter: Secondary | ICD-10-CM

## 2018-05-07 NOTE — Progress Notes (Signed)
New Patient Note  RE: Sheryl [name] MRN: [id number] DOB: 1966-02-10 Date of Office Visit: 05/07/2018  Referring provider: Glendale Chard, MD Primary care provider: Glendale Chard, MD  Chief Complaint: allergic rhinitis  History of present illness: Sheryl [name] is a 52 y.o. female presenting today for consultation for allergic rhinitis.    She states she has been having nasal drainage with PND for the past 3 years that is worse in the morning and does persist into the afternoon.  She has used zyrtec in the past.  Recently changed to xyzal and singulair was added to her regimen.  She does feel that xzyal is more effective antihistamine for him.  She has also used nasonex but states was too expensive thus she changed to flonase and using 1 spray each nostril which helps some.  She also reports having watery eyes.   These symptoms are worse during pollen season.    She has had allergy testing done about 8 years ago and recalls being positive to pollens, cat and had food testing done and states was positive to milk.   She states prior to having the allergy testing done she had bought a variety cereal pack and used milk in her cereal.  Shortly after ingestion she developed lip swelling.  She states she was told she is allergic to milk and was prescribed with an epipen.  She avoids liquid milk but states she is able to eat ice cream, yogurts and cheeses without issues.   She denies any history of asthma or eczema.    Review of systems: Review of Systems  Constitutional: Negative for chills, fever and malaise/fatigue.  HENT: Positive for congestion. Negative for ear discharge, ear pain, nosebleeds, sinus pain and sore throat.   Eyes: Negative for pain, discharge and redness.  Respiratory: Negative for cough, shortness of breath and wheezing.   Cardiovascular: Negative for chest pain.  Gastrointestinal: Negative for abdominal pain, constipation, diarrhea, heartburn, nausea and vomiting.   Musculoskeletal: Negative for joint pain.  Skin: Negative for itching and rash.  Neurological: Negative for headaches.    All other systems negative unless noted above in HPI  Past medical history: Past Medical History:  Diagnosis Date  . Dysmenorrhea   . Endocervical polyp   . Fibroids   . GERD (gastroesophageal reflux disease)    diet controlled  . H/O varicella   . History of chicken pox   . History of measles   . History of measles, mumps, or rubella   . History of mumps   . Menorrhagia   . Nocturia   . Unstable bladder   . Urinary frequency   . Yeast infection     Past surgical history: Past Surgical History:  Procedure Laterality Date  . MYOMECTOMY N/A 10/28/2013   Procedure: MYOMECTOMY;  Surgeon: Ena Dawley, MD;  Location: [facility];  Service: Gynecology;  Laterality: N/A;    Family history:  Family History  Problem Relation Age of Onset  . Cancer Mother   . Hypertension Mother   . Diabetes Father   . Mental illness Father   . Diabetes Paternal Grandfather     Social history: Lives in a townhome with carpeting with electric and gas heating and central cooling.  No pets in the home but neighbors have cats.  No concern for water damage, mildew or roaches in the home.  She works as a Psychologist, sport and exercise.  Denies smoking history.    Medication List: Allergies as of 05/07/2018  Reactions   Almond (diagnostic) Anaphylaxis   Cat Hair Extract    Milk-related Compounds       Medication List        Accurate as of 05/07/18  4:52 PM. Always use your most recent med list.          B-12 PO Take by mouth.   BIOTIN PO biotin   CALCARB 600/D 600-400 MG-UNIT tablet Generic drug:  Calcium Carbonate-Vitamin D Take 1 tablet by mouth daily.   cyclobenzaprine 10 MG tablet Commonly known as:  FLEXERIL   EPIPEN 2-PAK 0.3 mg/0.3 mL Soaj injection Generic drug:  EPINEPHrine EpiPen 2-Pak 0.3 mg/0.3 mL injection, auto-injector   fluticasone 50  MCG/ACT nasal spray Commonly known as:  FLONASE fluticasone propionate 50 mcg/actuation nasal spray,suspension   HAIR/SKIN/NAILS PO Take by mouth.   ibuprofen 600 MG tablet Commonly known as:  ADVIL,MOTRIN 1 po pc q 6 hours x 5 days then prn-pain   levocetirizine 5 MG tablet Commonly known as:  XYZAL levocetirizine 5 mg tablet   MILK THISTLE PO Take by mouth.   MULTIVITAMIN ADULTS PO multivitamin   RESTORA PO Restora   TURMERIC PO Take by mouth.   UNABLE TO FIND Herbalife   VITAMIN D3 PO Vitamin D3       Known medication allergies: Allergies  Allergen Reactions  . Almond (Diagnostic) Anaphylaxis  . Cat Hair Extract   . Milk-Related Compounds      Physical examination: Blood pressure 132/74, pulse 80, temperature 98.7 F (37.1 C), temperature source Oral, resp. rate 18, height 5\' 5"  (1.651 m), weight 168 lb (76.2 kg), SpO2 98 %.  General: Alert, interactive, in no acute distress. HEENT: PERRLA, TMs pearly gray, turbinates moderately edematous with clear discharge, post-pharynx non erythematous. Neck: Supple without lymphadenopathy. Lungs: Clear to auscultation without wheezing, rhonchi or rales. {no increased work of breathing. CV: Normal S1, S2 without murmurs. Abdomen: Nondistended, nontender. Skin: Warm and dry, without lesions or rashes. Extremities:  No clubbing, cyanosis or edema. Neuro:   Grossly intact.  Diagnositics/Labs:  Allergy testing: environmental allergy skin prick testing is negative.  Intradermal testing is slightly reactive to ragweed mix.   Allergy testing results were read and interpreted by provider, documented by clinical staff.   Assessment and plan:   Allergic rhinoconjunctivitis   - environmental allergy skin prick testing is today is slightly reactive to ragweed.  Thus will obtain environmental allergy panel to complete allergy testing.    - continue Xyzal 5mg  daily   - continue Singulair 10mg  daily - take at bedtime   -  for nasal congestion use Flonase, nasal steroid spray, 1-2 sprays each nostril daily.  Use for 1-2 weeks before stopping once symptoms improve.     - for nasal drainage/post-nasal drip use Astelin, nasal antihistamine, 2 sprays each nostril twice a day as needed   - for itchy/watery/red eyes use Pazeo or Pataday 1 drop each eye as needed   Adverse food reaction    - skin prick testing to milk is negative    - you do not have true IgE mediated food allergy to milk as you are able to tolerate ice cream, yogurts, cheeses.  Offered office-challenge to straight milk if she is interested in putting this form of dairy back into her diet.     - you have access to self-injectable epinephrine (Epipen or AuviQ) 0.3mg  at all times    - follow emergency action plan in case of allergic reaction  Follow-up 4-6 months  or sooner if needed  I appreciate the opportunity to take part in [place] care. Please do not hesitate to contact me with questions.  Sincerely,   Prudy Feeler, MD Allergy/Immunology [facility]

## 2018-05-07 NOTE — Patient Instructions (Addendum)
Allergic rhinoconjunctivitis   - environmental allergy skin prick testing is today is slightly reactive to ragweed.  Thus will obtain environmental allergy panel   - continue Xyzal 5mg  daily   - continue Singulair 10mg  daily - take at bedtime   - for nasal congestion use Flonase, nasal steroid spray, 1-2 sprays each nostril daily.  Use for 1-2 weeks before stopping once symptoms improve.     - for nasal drainage/post-nasal drip use Astelin, nasal antihistamine, 2 sprays each nostril twice a day as needed   - for itchy/watery/red eyes use Pazeo or Pataday 1 drop each eye as needed   Adverse food reaction    - skin prick testing to milk is negative    - you do not have true IgE mediated food allergy to milk as you are able to tolerate ice cream, yogurts, cheeses    - you have access to self-injectable epinephrine (Epipen or AuviQ) 0.3mg  at all times    - follow emergency action plan in case of allergic reaction  Follow-up 4-6 months or sooner if needed

## 2018-05-14 LAB — ALLERGENS W/TOTAL IGE AREA 2
Aspergillus Fumigatus IgE: <0.1 kU/L
Bermuda Grass IgE: <0.1 kU/L
Cat Dander IgE: <0.1 kU/L
Cedar, Mountain IgE: <0.1 kU/L
Cladosporium Herbarum IgE: <0.1 kU/L
D Farinae IgE: <0.1 kU/L
D Pteronyssinus IgE: <0.1 kU/L
Elm, American IgE: <0.1 kU/L
IgE (Immunoglobulin E), Serum: 23 IU/mL (ref 6–495)
Maple/Box Elder IgE: <0.1 kU/L
Pecan, Hickory IgE: <0.1 kU/L
Penicillium Chrysogen IgE: <0.1 kU/L
Ragweed, Short IgE: <0.1 kU/L
Sheep Sorrel IgE Qn: <0.1 kU/L
White Mulberry IgE: <0.1 kU/L

## 2018-10-02 ENCOUNTER — Ambulatory Visit: Payer: BLUE CROSS/BLUE SHIELD | Admitting: Nurse Practitioner

## 2018-10-02 ENCOUNTER — Encounter: Payer: Self-pay | Admitting: Nurse Practitioner

## 2018-10-02 VITALS — BP 140/88 | HR 71 | Temp 97.7°F | Ht 65.5 in | Wt 155.0 lb

## 2018-10-02 DIAGNOSIS — S81012A Laceration without foreign body, left knee, initial encounter: Secondary | ICD-10-CM | POA: Diagnosis not present

## 2018-10-02 DIAGNOSIS — W100XXA Fall (on)(from) escalator, initial encounter: Secondary | ICD-10-CM | POA: Diagnosis not present

## 2018-10-02 DIAGNOSIS — Y9269 Other specified industrial and construction area as the place of occurrence of the external cause: Secondary | ICD-10-CM

## 2018-10-02 NOTE — Progress Notes (Signed)
Subjective:     Patient ID: Sheryl [name] , female    DOB: Apr 15, 1966 , 52 y.o.   MRN: [id number]   They are attempting to remove the sutures on Friday.  No bending afterwards due to the depth of the laceration.  She is currently on Cephalexin 500 mg 3 times per day on the 10/8.  Works from home.  She works at [employer], fell going up escalator after turning around to go get something slipped and fell.  She is using crutches.  Since Monday has been airing out the knee at bedtime  Knee Pain   The incident occurred more than 1 week ago. The incident occurred at work (She fell on the escalator at work with laceration with 7 stitches.  She is going to [facility]. ). The injury mechanism was a fall. The pain is mild. Associated symptoms include an inability to bear weight. Pertinent negatives include no loss of motion, loss of sensation, muscle weakness, numbness or tingling. She reports no foreign bodies present. Treatments tried: Ibuprofen.     Past Medical History:  Diagnosis Date  . Dysmenorrhea   . Endocervical polyp   . Fibroids   . GERD (gastroesophageal reflux disease)    diet controlled  . H/O varicella   . History of chicken pox   . History of measles   . History of measles, mumps, or rubella   . History of mumps   . Menorrhagia   . Nocturia   . Unstable bladder   . Urinary frequency   . Yeast infection       Current Outpatient Medications:  .  BIOTIN PO, biotin, Disp: , Rfl:  .  Calcium Carbonate-Vitamin D (CALCARB 600/D) 600-400 MG-UNIT per tablet, Take 1 tablet by mouth daily., Disp: , Rfl:  .  Cholecalciferol (VITAMIN D3 PO), Vitamin D3, Disp: , Rfl:  .  Cyanocobalamin (B-12 PO), Take by mouth., Disp: , Rfl:  .  EPINEPHrine (EPIPEN 2-PAK) 0.3 mg/0.3 mL IJ SOAJ injection, EpiPen 2-Pak 0.3 mg/0.3 mL injection, auto-injector, Disp: , Rfl:  .  fluticasone (FLONASE) 50 MCG/ACT nasal spray, fluticasone propionate 50 mcg/actuation nasal spray,suspension,  Disp: , Rfl:  .  ibuprofen (ADVIL,MOTRIN) 600 MG tablet, 1 po pc q 6 hours x 5 days then prn-pain, Disp: 30 tablet, Rfl: 1 .  levocetirizine (XYZAL) 5 MG tablet, levocetirizine 5 mg tablet, Disp: , Rfl:  .  MILK THISTLE PO, Take by mouth., Disp: , Rfl:  .  Multiple Vitamins-Minerals (MULTIVITAMIN ADULTS PO), multivitamin, Disp: , Rfl:  .  Probiotic Product (RESTORA PO), Restora, Disp: , Rfl:  .  TURMERIC PO, Take by mouth., Disp: , Rfl:  .  UNABLE TO FIND, Herbalife, Disp: , Rfl:  .  Biotin w/ Vitamins C & E (HAIR/SKIN/NAILS PO), Take by mouth., Disp: , Rfl:    Allergies  Allergen Reactions  . Almond (Diagnostic) Anaphylaxis  . Cat Hair Extract   . Milk-Related Compounds      Review of Systems  Constitutional: Negative.   Respiratory: Negative.   Cardiovascular: Negative.   Musculoskeletal:       Unable to bend her left leg due to the laceration approximately 3 cm in length, sutures are intact.  Neurological: Negative for tingling and numbness.     Today's Vitals   10/02/18 1513  BP: 140/88  Pulse: 71  Temp: 97.7 F (36.5 C)  TempSrc: Oral  SpO2: 97%  Weight: 155 lb (70.3 kg)  Height: 5' 5.5" (  1.664 m)  PainSc: 0-No pain   Body mass index is 25.4 kg/m.   Objective:  Physical Exam  Constitutional: She appears well-developed and well-nourished.  Pulmonary/Chest: Effort normal.  Musculoskeletal:       Legs: Skin: Skin is warm and dry.        Assessment And Plan:     1. Laceration of left knee, initial encounter  Healing well has small amount of erythema to the knee  Advised if possible to wait another 2 days to allow for more healing before removing.    Continue to keep knee straight and use crutches  2. Fall (on)(from) escalator, initial encounter  [address] at work and has laceration to left knee  Continue follow up with your workers comp provider    Minette Brine, FNP

## 2018-10-24 IMAGING — US US ABDOMEN LIMITED
1 series · 14 of 25 positions shown · non-contrast
Comparison: None.

CLINICAL DATA: Elevated liver function tests.

EXAM:
US ABDOMEN LIMITED - RIGHT UPPER QUADRANT

[Series 1: us abdomen limited · 0.17mm/px · 14 of 45 slices shown]
[im 1/45]
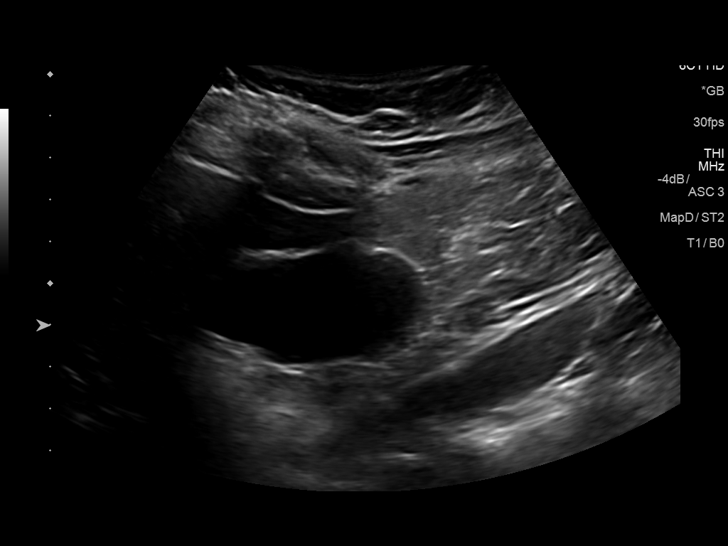
[im 4/45]
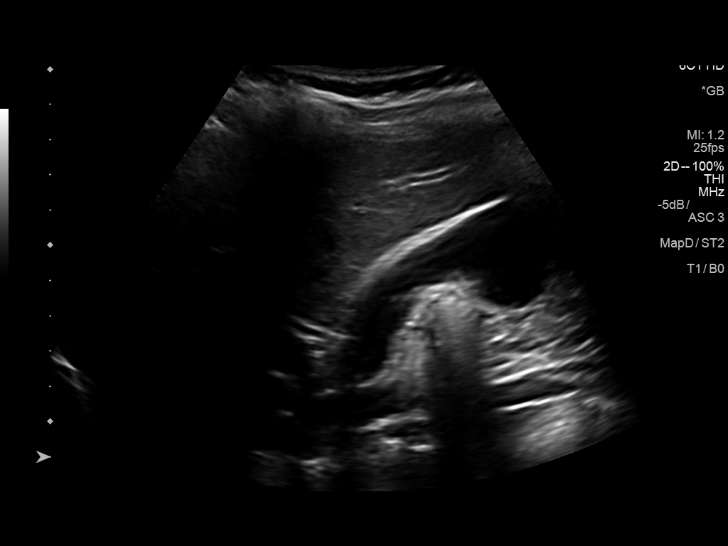
[im 8/45]
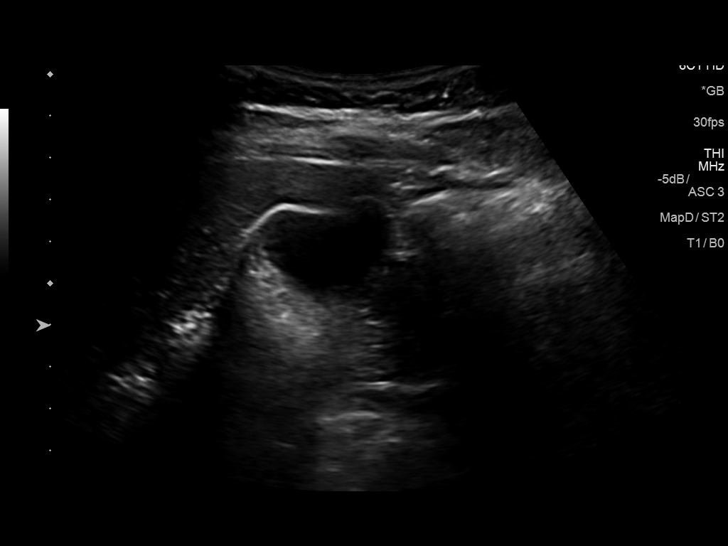
[im 12/45]
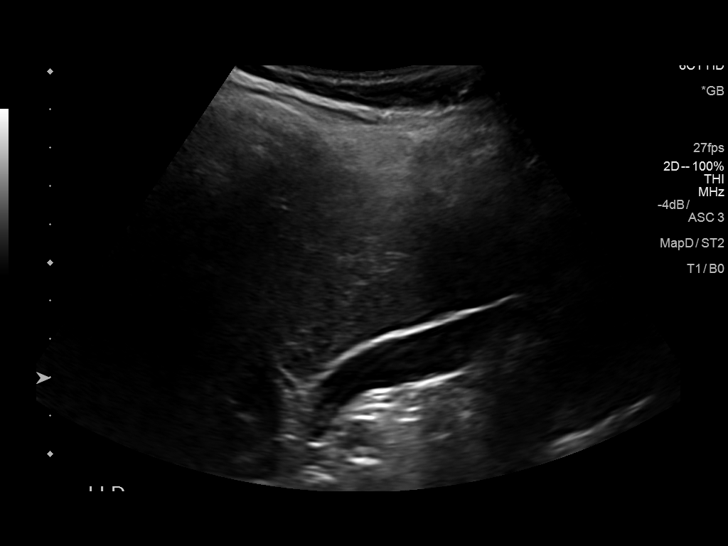
[im 15/45]
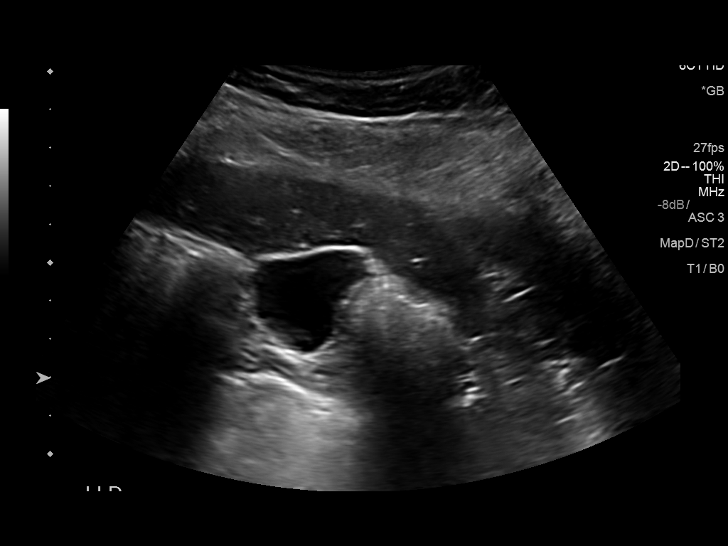
[im 17/45]
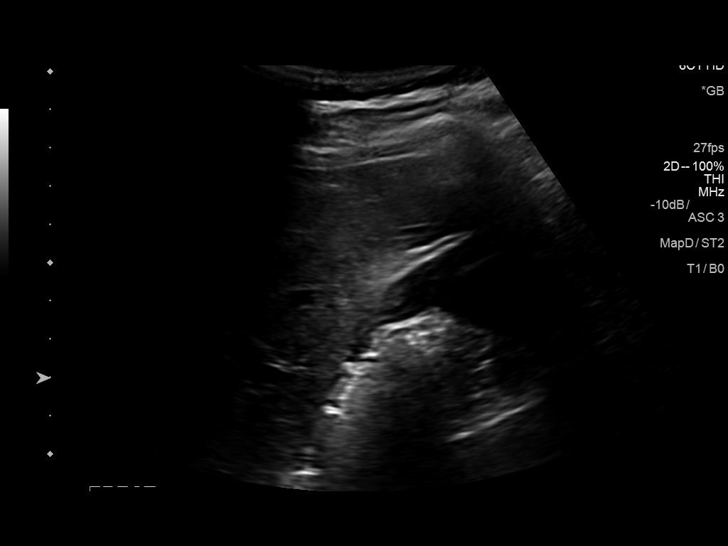
[im 21/45]
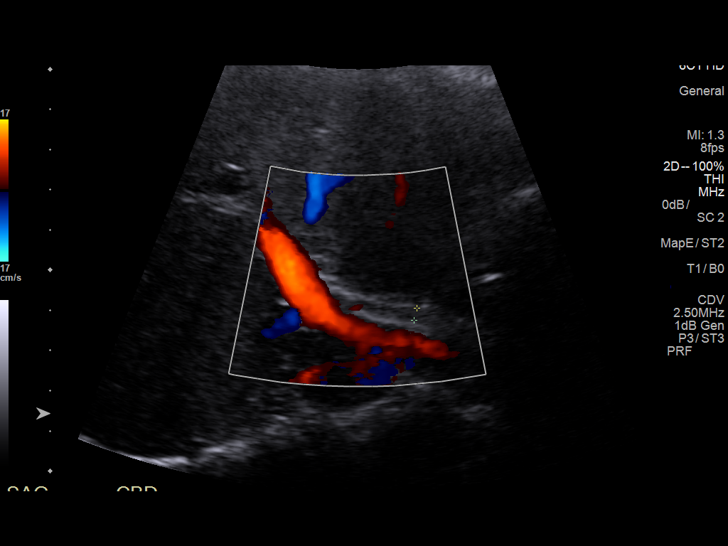
[im 24/45]
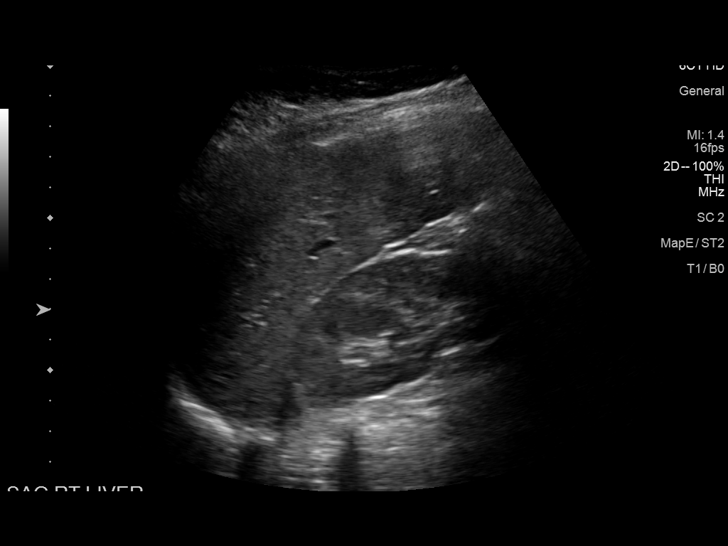
[im 28/45]
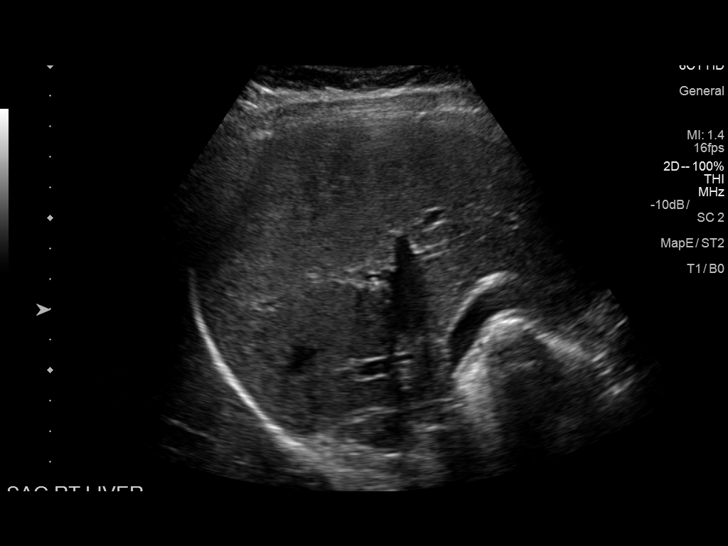
[im 30/45]
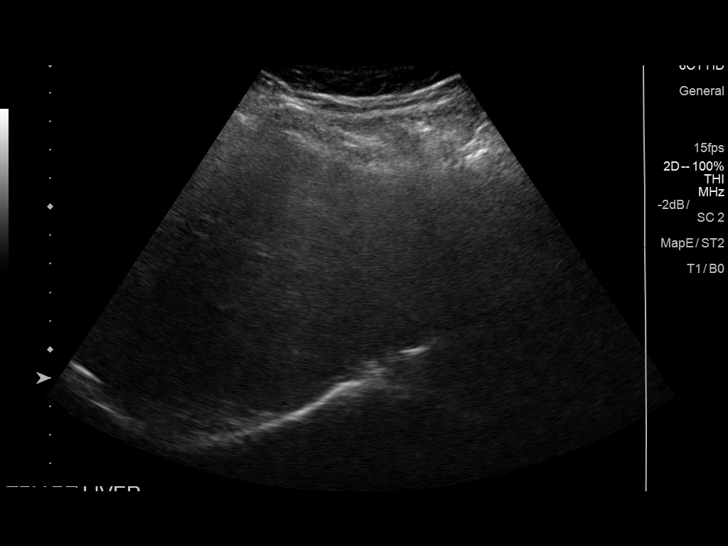
[im 34/45]
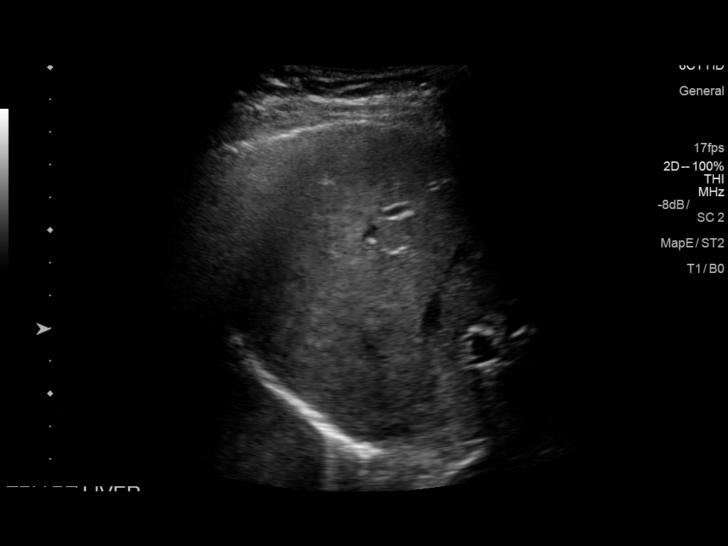
[im 37/45]
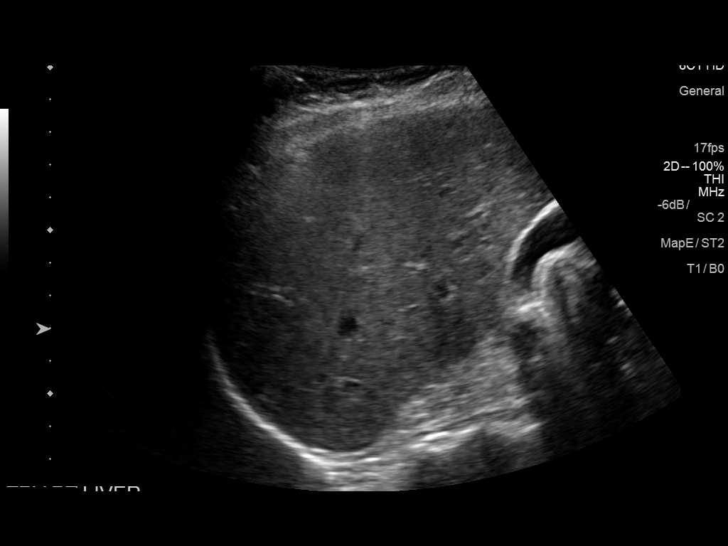
[im 41/45]
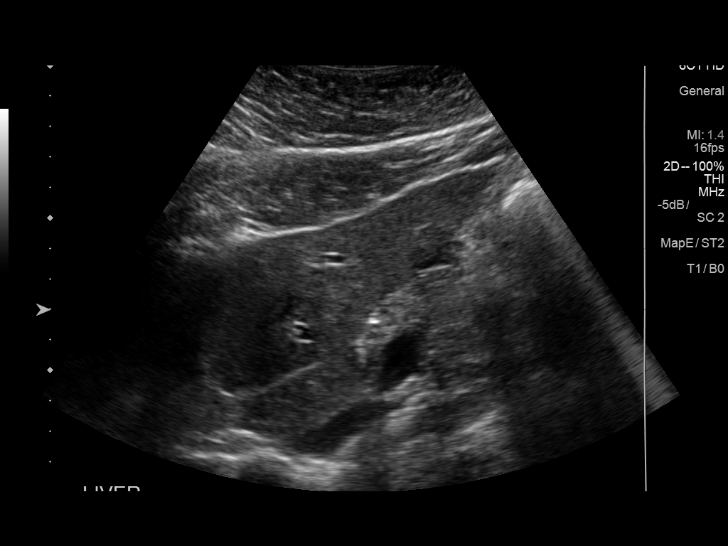
[im 45/45]
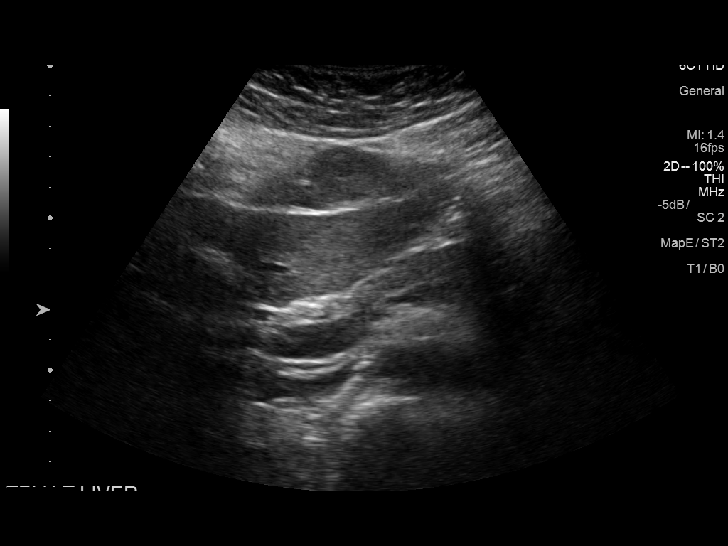

[14 of 25 positions shown; findings below may reference images not displayed]

FINDINGS: Gallbladder:

No gallstones or wall thickening visualized. No sonographic Murphy
sign noted by sonographer.

Common bile duct:

Diameter: 3.1 mm which is within normal limits.

Liver:

No focal lesion identified. Within normal limits in parenchymal
echogenicity.
IMPRESSION: No abnormality seen in the right upper quadrant of the abdomen.

## 2019-02-05 ENCOUNTER — Telehealth: Payer: Self-pay

## 2019-02-05 NOTE — Telephone Encounter (Signed)
Left the pt a message 

## 2019-02-05 NOTE — Telephone Encounter (Signed)
-----   Message from Glendale Chard, MD sent at 02/05/2019 11:08 AM EST ----- She should stay with the practice. There are other doctors there - Drs. Vanessa Barbara and Rivard.   RS ----- Message ----- From: Michelle Nasuti, [place] Sent: 02/05/2019   9:04 AM EST To: Glendale Chard, MD  The patient would like to know if you could give her a recommendation on an OBGYN because Dr. Raphael Gibney or his wife is no longer  at the practice that she goes to

## 2019-04-03 ENCOUNTER — Other Ambulatory Visit: Payer: Self-pay | Admitting: Obstetrics and Gynecology

## 2019-04-22 ENCOUNTER — Ambulatory Visit: Payer: BLUE CROSS/BLUE SHIELD | Admitting: Internal Medicine

## 2019-04-22 ENCOUNTER — Other Ambulatory Visit: Payer: Self-pay | Admitting: Internal Medicine

## 2019-04-22 ENCOUNTER — Other Ambulatory Visit: Payer: Self-pay

## 2019-04-22 ENCOUNTER — Encounter: Payer: Self-pay | Admitting: Internal Medicine

## 2019-04-22 VITALS — BP 122/80 | HR 73 | Temp 98.2°F | Ht 66.4 in | Wt 163.4 lb

## 2019-04-22 DIAGNOSIS — Z0000 Encounter for general adult medical examination without abnormal findings: Secondary | ICD-10-CM | POA: Diagnosis not present

## 2019-04-22 DIAGNOSIS — E559 Vitamin D deficiency, unspecified: Secondary | ICD-10-CM

## 2019-04-22 DIAGNOSIS — L659 Nonscarring hair loss, unspecified: Secondary | ICD-10-CM

## 2019-04-22 DIAGNOSIS — Z23 Encounter for immunization: Secondary | ICD-10-CM

## 2019-04-22 DIAGNOSIS — M25511 Pain in right shoulder: Secondary | ICD-10-CM

## 2019-04-22 LAB — POCT URINALYSIS DIPSTICK
Bilirubin, UA: NEGATIVE
Glucose, UA: NEGATIVE
Ketones, UA: NEGATIVE
Leukocytes, UA: NEGATIVE
Nitrite, UA: NEGATIVE
Protein, UA: NEGATIVE
Spec Grav, UA: 1.02 (ref 1.010–1.025)
Urobilinogen, UA: 0.2 E.U./dL
pH, UA: 7.5 (ref 5.0–8.0)

## 2019-04-22 MED ORDER — RESTORA PO CAPS: ORAL_CAPSULE | ORAL | 1 refills | Status: DC

## 2019-04-22 MED ORDER — LEVOCETIRIZINE DIHYDROCHLORIDE 5 MG PO TABS: 5.0000 mg | ORAL_TABLET | Freq: Every evening | ORAL | 1 refills | Status: DC

## 2019-04-22 MED ORDER — TETANUS-DIPHTH-ACELL PERTUSSIS 5-2.5-18.5 LF-MCG/0.5 IM SUSP
0.5000 mL | Freq: Once | INTRAMUSCULAR | Status: AC
  Administered 2019-04-22: 0.5 mL via INTRAMUSCULAR

## 2019-04-22 NOTE — Progress Notes (Signed)
Subjective:     Patient ID: Arn Medal , female    DOB: [birth year] , 53 y.o.   MRN: [id number]   Chief Complaint  Patient presents with  . Annual Exam    HPI  She is here today for a full physical examination.  She is now followed by Dr. Garwin Brothers for her GYN exams.  She was previously seen by Dr. Raphael Gibney, she did not know he had retired.  She reports having several concerns to discuss today.     Past Medical History:  Diagnosis Date  . Dysmenorrhea   . Endocervical polyp   . Fibroids   . GERD (gastroesophageal reflux disease)    diet controlled  . H/O varicella   . History of chicken pox   . History of measles   . History of measles, mumps, or rubella   . History of mumps   . Menorrhagia   . Nocturia   . Unstable bladder   . Urinary frequency   . Yeast infection      Family History  Problem Relation Age of Onset  . Diverticulitis Mother   . Diabetes Father   . Diabetes Paternal Grandfather      Current Outpatient Medications:  .  Ascorbic Acid (VITAMIN C PO), Take by mouth., Disp: , Rfl:  .  Biotin w/ Vitamins C & E (HAIR/SKIN/NAILS PO), Take by mouth., Disp: , Rfl:  .  BLACK COHOSH EXTRACT PO, Take by mouth., Disp: , Rfl:  .  Calcium Carbonate-Vitamin D (CALCARB 600/D) 600-400 MG-UNIT per tablet, Take 1 tablet by mouth daily., Disp: , Rfl:  .  Cholecalciferol (VITAMIN D3 PO), 5,000 Units daily. , Disp: , Rfl:  .  Cyanocobalamin (B-12 PO), Take by mouth., Disp: , Rfl:  .  EPINEPHrine (EPIPEN 2-PAK) 0.3 mg/0.3 mL IJ SOAJ injection, EpiPen 2-Pak 0.3 mg/0.3 mL injection, auto-injector, Disp: , Rfl:  .  fluticasone (FLONASE) 50 MCG/ACT nasal spray, fluticasone propionate 50 mcg/actuation nasal spray,suspension, Disp: , Rfl:  .  ibuprofen (ADVIL,MOTRIN) 600 MG tablet, 1 po pc q 6 hours x 5 days then prn-pain, Disp: 30 tablet, Rfl: 1 .  levocetirizine (XYZAL) 5 MG tablet, Take 1 tablet (5 mg total) by mouth every evening., Disp: 90 tablet, Rfl: 1 .  MILK THISTLE  PO, Take by mouth., Disp: , Rfl:  .  Multiple Vitamins-Minerals (MULTIVITAMIN ADULTS PO), multivitamin, Disp: , Rfl:  .  Probiotic Product (RESTORA) CAPS, Take 1 capsule by mouth daily, Disp: 90 capsule, Rfl: 1 .  TURMERIC PO, Take by mouth., Disp: , Rfl:    Allergies  Allergen Reactions  . Almond (Diagnostic) Anaphylaxis  . Cat Hair Extract   . Milk-Related Compounds      The patient states she uses post menopausal status for birth control. Last LMP was No LMP recorded. Patient is perimenopausal.. Negative for Dysmenorrhea  Negative for: breast discharge, breast lump(s), breast pain and breast self exam. Associated symptoms include abnormal vaginal bleeding. Pertinent negatives include abnormal bleeding (hematology), anxiety, decreased libido, depression, difficulty falling sleep, dyspareunia, history of infertility, nocturia, sexual dysfunction, sleep disturbances, urinary incontinence, urinary urgency, vaginal discharge and vaginal itching. Diet regular.The patient states her exercise level is  intermittent.  . The patient's tobacco use is:  Social History   Tobacco Use  Smoking Status Never Smoker  Smokeless Tobacco Never Used  . She has been exposed to passive smoke. The patient's alcohol use is:  Social History   Substance and Sexual Activity  Alcohol Use Yes  Review of Systems  Constitutional: Negative.   Eyes: Negative.   Respiratory: Negative.   Cardiovascular: Negative.   Gastrointestinal: Negative.   Endocrine: Negative.   Genitourinary: Negative.   Musculoskeletal: Positive for arthralgias.       She c/o shoulder pain. There is pain with movement. Reports having a fall back in October. She is still having issues.   Skin:       She c/o hair loss. She first noticed this a month or so ago. She spoke with Dr. Garwin Brothers, her new GYN, who suggested she speak to her beautician in [place]. She suggested some OTC products to use, she has purchased them. Not sure if she has  noticed a difference. She does admit to increased work stressors due to COVID-19 pandemic.   Allergic/Immunologic: Negative.   Neurological: Negative.   Psychiatric/Behavioral: Negative.      Today's Vitals   04/22/19 0935  BP: 122/80  Pulse: 73  Temp: 98.2 F (36.8 C)  TempSrc: Oral  Weight: 163 lb 6.4 oz (74.1 kg)  Height: 5' 6.4" (1.687 m)   Body mass index is 26.06 kg/m.   Objective:  Physical Exam Vitals signs and nursing note reviewed.  Constitutional:      Appearance: Normal appearance.  HENT:     Head: Normocephalic and atraumatic.     Right Ear: Tympanic membrane, ear canal and external ear normal.     Left Ear: Tympanic membrane, ear canal and external ear normal.     Nose: Nose normal.     Mouth/Throat:     Mouth: Mucous membranes are moist.     Pharynx: Oropharynx is clear.  Eyes:     Extraocular Movements: Extraocular movements intact.     Conjunctiva/sclera: Conjunctivae normal.     Pupils: Pupils are equal, round, and reactive to light.  Neck:     Musculoskeletal: Normal range of motion and neck supple.  Cardiovascular:     Rate and Rhythm: Normal rate and regular rhythm.     Pulses: Normal pulses.     Heart sounds: Normal heart sounds.  Pulmonary:     Effort: Pulmonary effort is normal.     Breath sounds: Normal breath sounds.  Chest:     Breasts:        Right: Normal. No swelling, bleeding, inverted nipple, mass or nipple discharge.        Left: Normal. No swelling, bleeding, inverted nipple, mass or nipple discharge.  Abdominal:     General: Abdomen is flat. Bowel sounds are normal.     Palpations: Abdomen is soft.  Genitourinary:    Comments: deferred Musculoskeletal: Normal range of motion.        General: Tenderness present.     Comments: r shoulder tender to palpation, pain with lateral abduction. No overlying erythema  Skin:    General: Skin is warm and dry.  Neurological:     General: No focal deficit present.     Mental Status: She  is alert and oriented to person, place, and time.  Psychiatric:        Mood and Affect: Mood normal.        Behavior: Behavior normal.         Assessment And Plan:     1. Routine general medical examination at health care facility  A full exam was performed. Importance of monthly self breast exams was discussed with the patient. PATIENT HAS BEEN ADVISED TO GET 30-45 MINUTES REGULAR EXERCISE NO LESS THAN FOUR TO  FIVE DAYS PER WEEK - BOTH WEIGHTBEARING EXERCISES AND AEROBIC ARE RECOMMENDED.  SHE  WAS ADVISED TO FOLLOW A HEALTHY DIET WITH AT LEAST SIX FRUITS/VEGGIES PER DAY, DECREASE INTAKE OF RED MEAT, AND TO INCREASE FISH INTAKE TO TWO DAYS PER WEEK.  MEATS/FISH SHOULD NOT BE FRIED, BAKED OR BROILED IS PREFERABLE.  I SUGGEST WEARING SPF 50 SUNSCREEN ON EXPOSED PARTS AND ESPECIALLY WHEN IN THE DIRECT SUNLIGHT FOR AN EXTENDED PERIOD OF TIME.  PLEASE AVOID FAST FOOD RESTAURANTS AND INCREASE YOUR WATER INTAKE.  - CMP14+EGFR - CBC - Lipid panel - Hemoglobin A1c - POCT Urinalysis Dipstick (81002)  2. Hair loss disorder  I will refer her to dermatology for further evaluation. She is in agreement with her treatment plan.   - ANA, IFA (with reflex) - Testosterone,Free and Total  3. Right shoulder pain  I think her sx are due to fall. However, I will check arthritis panel as well.   - Sed Rate (ESR) - CYCLIC CITRUL PEPTIDE ANTIBODY, IGG/IGA - Rheumatoid factor - Uric acid  4. Vitamin D deficiency disease  I WILL CHECK A VIT D LEVEL AND SUPPLEMENT AS NEEDED.  ALSO ENCOURAGED TO SPEND 15 MINUTES IN THE SUN DAILY.   5. Need for vaccination  - Tdap (BOOSTRIX) injection 0.5 mL        [name], MD    THE PATIENT IS ENCOURAGED TO PRACTICE SOCIAL DISTANCING DUE TO THE COVID-19 PANDEMIC.

## 2019-04-22 NOTE — Patient Instructions (Addendum)
Health Maintenance, Female Adopting a healthy lifestyle and getting preventive care can go a long way to promote health and wellness. Talk with your health care provider about what schedule of regular examinations is right for you. This is a good chance for you to check in with your provider about disease prevention and staying healthy. In between checkups, there are plenty of things you can do on your own. Experts have done a lot of research about which lifestyle changes and preventive measures are most likely to keep you healthy. Ask your health care provider for more information. Weight and diet Eat a healthy diet  Be sure to include plenty of vegetables, fruits, low-fat dairy products, and lean protein.  Do not eat a lot of foods high in solid fats, added sugars, or salt.  Get regular exercise. This is one of the most important things you can do for your health. ? Most adults should exercise for at least 150 minutes each week. The exercise should increase your heart rate and make you sweat (moderate-intensity exercise). ? Most adults should also do strengthening exercises at least twice a week. This is in addition to the moderate-intensity exercise. Maintain a healthy weight  Body mass index (BMI) is a measurement that can be used to identify possible weight problems. It estimates body fat based on height and weight. Your health care provider can help determine your BMI and help you achieve or maintain a healthy weight.  For females 20 years of age and older: ? A BMI below 18.5 is considered underweight. ? A BMI of 18.5 to 24.9 is normal. ? A BMI of 25 to 29.9 is considered overweight. ? A BMI of 30 and above is considered obese. Watch levels of cholesterol and blood lipids  You should start having your blood tested for lipids and cholesterol at 53 years of age, then have this test every 5 years.  You may need to have your cholesterol levels checked more often if: ? Your lipid or  cholesterol levels are high. ? You are older than 53 years of age. ? You are at high risk for heart disease. Cancer screening Lung Cancer  Lung cancer screening is recommended for adults 55-80 years old who are at high risk for lung cancer because of a history of smoking.  A yearly low-dose CT scan of the lungs is recommended for people who: ? Currently smoke. ? Have quit within the past 15 years. ? Have at least a 30-pack-year history of smoking. A pack year is smoking an average of one pack of cigarettes a day for 1 year.  Yearly screening should continue until it has been 15 years since you quit.  Yearly screening should stop if you develop a health problem that would prevent you from having lung cancer treatment. Breast Cancer  Practice breast self-awareness. This means understanding how your breasts normally appear and feel.  It also means doing regular breast self-exams. Let your health care provider know about any changes, no matter how small.  If you are in your 20s or 30s, you should have a clinical breast exam (CBE) by a health care provider every 1-3 years as part of a regular health exam.  If you are 40 or older, have a CBE every year. Also consider having a breast X-ray (mammogram) every year.  If you have a family history of breast cancer, talk to your health care provider about genetic screening.  If you are at high risk for breast cancer, talk   to your health care provider about having an MRI and a mammogram every year.  Breast cancer gene (BRCA) assessment is recommended for women who have family members with BRCA-related cancers. BRCA-related cancers include: ? Breast. ? Ovarian. ? Tubal. ? Peritoneal cancers.  Results of the assessment will determine the need for genetic counseling and BRCA1 and BRCA2 testing. Cervical Cancer Your health care provider may recommend that you be screened regularly for cancer of the pelvic organs (ovaries, uterus, and vagina).  This screening involves a pelvic examination, including checking for microscopic changes to the surface of your cervix (Pap test). You may be encouraged to have this screening done every 3 years, beginning at age 21.  For women ages 30-65, health care providers may recommend pelvic exams and Pap testing every 3 years, or they may recommend the Pap and pelvic exam, combined with testing for human papilloma virus (HPV), every 5 years. Some types of HPV increase your risk of cervical cancer. Testing for HPV may also be done on women of any age with unclear Pap test results.  Other health care providers may not recommend any screening for nonpregnant women who are considered low risk for pelvic cancer and who do not have symptoms. Ask your health care provider if a screening pelvic exam is right for you.  If you have had past treatment for cervical cancer or a condition that could lead to cancer, you need Pap tests and screening for cancer for at least 20 years after your treatment. If Pap tests have been discontinued, your risk factors (such as having a new sexual partner) need to be reassessed to determine if screening should resume. Some women have medical problems that increase the chance of getting cervical cancer. In these cases, your health care provider may recommend more frequent screening and Pap tests. Colorectal Cancer  This type of cancer can be detected and often prevented.  Routine colorectal cancer screening usually begins at 53 years of age and continues through 53 years of age.  Your health care provider may recommend screening at an earlier age if you have risk factors for colon cancer.  Your health care provider may also recommend using home test kits to check for hidden blood in the stool.  A small camera at the end of a tube can be used to examine your colon directly (sigmoidoscopy or colonoscopy). This is done to check for the earliest forms of colorectal cancer.  Routine  screening usually begins at age 50.  Direct examination of the colon should be repeated every 5-10 years through 53 years of age. However, you may need to be screened more often if early forms of precancerous polyps or small growths are found. Skin Cancer  Check your skin from head to toe regularly.  Tell your health care provider about any new moles or changes in moles, especially if there is a change in a mole's shape or color.  Also tell your health care provider if you have a mole that is larger than the size of a pencil eraser.  Always use sunscreen. Apply sunscreen liberally and repeatedly throughout the day.  Protect yourself by wearing long sleeves, pants, a wide-brimmed hat, and sunglasses whenever you are outside. Heart disease, diabetes, and high blood pressure  High blood pressure causes heart disease and increases the risk of stroke. High blood pressure is more likely to develop in: ? People who have blood pressure in the high end of the normal range (130-139/85-89 mm Hg). ? People   who are overweight or obese. ? People who are African American.  If you are 84-22 years of age, have your blood pressure checked every 3-5 years. If you are 67 years of age or older, have your blood pressure checked every year. You should have your blood pressure measured twice-once when you are at a hospital or clinic, and once when you are not at a hospital or clinic. Record the average of the two measurements. To check your blood pressure when you are not at a hospital or clinic, you can use: ? An automated blood pressure machine at a pharmacy. ? A home blood pressure monitor.  If you are between 52 years and 3 years old, ask your health care provider if you should take aspirin to prevent strokes.  Have regular diabetes screenings. This involves taking a blood sample to check your fasting blood sugar level. ? If you are at a normal weight and have a low risk for diabetes, have this test once  every three years after 53 years of age. ? If you are overweight and have a high risk for diabetes, consider being tested at a younger age or more often. Preventing infection Hepatitis B  If you have a higher risk for hepatitis B, you should be screened for this virus. You are considered at high risk for hepatitis B if: ? You were born in a country where hepatitis B is common. Ask your health care provider which countries are considered high risk. ? Your parents were born in a high-risk country, and you have not been immunized against hepatitis B (hepatitis B vaccine). ? You have HIV or AIDS. ? You use needles to inject street drugs. ? You live with someone who has hepatitis B. ? You have had sex with someone who has hepatitis B. ? You get hemodialysis treatment. ? You take certain medicines for conditions, including cancer, organ transplantation, and autoimmune conditions. Hepatitis C  Blood testing is recommended for: ? Everyone born from [birth year] through 1965. ? Anyone with known risk factors for hepatitis C. Sexually transmitted infections (STIs)  You should be screened for sexually transmitted infections (STIs) including gonorrhea and chlamydia if: ? You are sexually active and are younger than 53 years of age. ? You are older than 53 years of age and your health care provider tells you that you are at risk for this type of infection. ? Your sexual activity has changed since you were last screened and you are at an increased risk for chlamydia or gonorrhea. Ask your health care provider if you are at risk.  If you do not have HIV, but are at risk, it may be recommended that you take a prescription medicine daily to prevent HIV infection. This is called pre-exposure prophylaxis (PrEP). You are considered at risk if: ? You are sexually active and do not regularly use condoms or know the HIV status of your partner(s). ? You take drugs by injection. ? You are sexually active with a partner  who has HIV. Talk with your health care provider about whether you are at high risk of being infected with HIV. If you choose to begin PrEP, you should first be tested for HIV. You should then be tested every 3 months for as long as you are taking PrEP. Pregnancy  If you are premenopausal and you may become pregnant, ask your health care provider about preconception counseling.  If you may become pregnant, take 400 to 800 micrograms (mcg) of folic acid every  day.  If you want to prevent pregnancy, talk to your health care provider about birth control (contraception). Osteoporosis and menopause  Osteoporosis is a disease in which the bones lose minerals and strength with aging. This can result in serious bone fractures. Your risk for osteoporosis can be identified using a bone density scan.  If you are 53 years of age or older, or if you are at risk for osteoporosis and fractures, ask your health care provider if you should be screened.  Ask your health care provider whether you should take a calcium or vitamin D supplement to lower your risk for osteoporosis.  Menopause may have certain physical symptoms and risks.  Hormone replacement therapy may reduce some of these symptoms and risks. Talk to your health care provider about whether hormone replacement therapy is right for you. Follow these instructions at home:  Schedule regular health, dental, and eye exams.  Stay current with your immunizations.  Do not use any tobacco products including cigarettes, chewing tobacco, or electronic cigarettes.  If you are pregnant, do not drink alcohol.  If you are breastfeeding, limit how much and how often you drink alcohol.  Limit alcohol intake to no more than 1 drink per day for nonpregnant women. One drink equals 12 ounces of beer, 5 ounces of wine, or 1 ounces of hard liquor.  Do not use street drugs.  Do not share needles.  Ask your health care provider for help if you need support  or information about quitting drugs.  Tell your health care provider if you often feel depressed.  Tell your health care provider if you have ever been abused or do not feel safe at home. This information is not intended to replace advice given to you by your health care provider. Make sure you discuss any questions you have with your health care provider. Document Released: 06/18/2011 Document Revised: 05/10/2016 Document Reviewed: 09/06/2015 Elsevier Interactive Patient Education  2019 [employer]   Shoulder Range of Motion Exercises Shoulder range of motion (ROM) exercises are done to keep the shoulder moving freely or to increase movement. They are often recommended for people who have shoulder pain or stiffness or who are recovering from a shoulder surgery. Phase 1 exercises When you are able, do this exercise 1-2 times per day for 30-60 seconds in each direction, or as directed by your health care provider. Pendulum exercise To do this exercise while sitting: 1. Sit in a chair or at the edge of your bed with your feet flat on the floor. 2. Let your affected arm hang down in front of you over the edge of the bed or chair. 3. Relax your shoulder, arm, and hand. [place] your body so your arm gently swings in small circles. You can also use your unaffected arm to start the motion. 5. Repeat changing the direction of the circles, swinging your arm left and right, and swinging your arm forward and back. To do this exercise while standing: 1. Stand next to a sturdy chair or table, and hold on to it with your hand on your unaffected side. 2. Bend forward at the waist. 3. Bend your knees slightly. 4. Relax your shoulder, arm, and hand. 5. While keeping your shoulder relaxed, use body motion to swing your arm in small circles. 6. Repeat changing the direction of the circles, swinging your arm left and right, and swinging your arm forward and back. 7. Between exercises, stand up tall and  take a short break  to relax your lower back.  Phase 2 exercises Do these exercises 1-2 times per day or as told by your health care provider. Hold each stretch for 30 seconds, and repeat 3 times. Do the exercises with one or both arms as instructed by your health care provider. For these exercises, sit at a table with your hand and arm supported by the table. A chair that slides easily or has wheels can be helpful. External rotation 1. Turn your chair so that your affected side is nearest to the table. 2. Place your forearm on the table to your side. Bend your elbow about 90 at the elbow (right angle) and place your hand palm facing down on the table. Your elbow should be about 6 inches away from your side. 3. Keeping your arm on the table, lean your body forward. Abduction 1. Turn your chair so that your affected side is nearest to the table. 2. Place your forearm and hand on the table so that your thumb points toward the ceiling and your arm is straight out to your side. 3. Slide your hand out to the side and away from you, using your unaffected arm to do the work. 4. To increase the stretch, you can slide your chair away from the table. Flexion: forward stretch 1. Sit facing the table. Place your hand and elbow on the table in front of you. 2. Slide your hand forward and away from you, using your unaffected arm to do the work. 3. To increase the stretch, you can slide your chair backward. Phase 3 exercises Do these exercises 1-2 times per day or as told by your health care provider. Hold each stretch for 30 seconds, and repeat 3 times. Do the exercises with one or both arms as instructed by your health care provider. Cross-body stretch: posterior capsule stretch 1. Lift your arm straight out in front of you. 2. Bend your arm 90 at the elbow (right angle) so your forearm moves across your body. 3. Use your other arm to gently pull the elbow across your body, toward your other  shoulder. Wall climbs 1. Stand with your affected arm extended out to the side with your hand resting on a door frame. 2. Slide your hand slowly up the door frame. 3. To increase the stretch, step through the door frame. Keep your body upright and do not lean. Wand exercises You will need a cane, a piece of PVC pipe, or a sturdy wooden dowel for wand exercises. Flexion To do this exercise while standing: 1. Hold the wand with both of your hands, palms down. 2. Using the other arm to help, lift your arms up and over your head, if able. 3. Push upward with your other arm to gently increase the stretch. To do this exercise while lying down: 1. Lie on your back with your elbows resting on the floor and the wand in both your hands. Your hands will be palm down, or pointing toward your feet. 2. Lift your hands toward the ceiling, using your unaffected arm to help if needed. 3. Bring your arms overhead as able, using your unaffected arm to help if needed. Internal rotation 1. Stand while holding the wand behind you with both hands. Your unaffected arm should be extended above your head with the arm of the affected side extended behind you at the level of your waist. The wand should be pointing straight up and down as you hold it. 2. Slowly pull the wand up behind  your back by straightening the elbow of your unaffected arm and bending the elbow of your affected arm. External rotation 1. Lie on your back with your affected upper arm supported on a small pillow or rolled towel. When you first do this exercise, keep your upper arm close to your body. Over time, bring your arm up to a 90 angle out to the side. 2. Hold the wand across your stomach and with both hands palm up. Your elbow on your affected side should be bent at a 90 angle. 3. Use your unaffected side to help push your forearm away from you and toward the floor. Keep your elbow on your affected side bent at a 90 angle. Contact a health  care provider if you have:  New or increasing pain.  New numbness, tingling, weakness, or discoloration in your arm or hand. This information is not intended to replace advice given to you by your health care provider. Make sure you discuss any questions you have with your health care provider. Document Released: 09/01/2003 Document Revised: 01/15/2018 Document Reviewed: 01/15/2018 Elsevier Interactive Patient Education  2019 [employer].

## 2019-04-24 LAB — CYCLIC CITRUL PEPTIDE ANTIBODY, IGG/IGA: Cyclic Citrullin Peptide Ab: 12 units (ref 0–19)

## 2019-04-24 LAB — CBC
Hematocrit: 43.4 % (ref 34.0–46.6)
Hemoglobin: 14.7 g/dL (ref 11.1–15.9)
MCH: 31.6 pg (ref 26.6–33.0)
MCHC: 33.9 g/dL (ref 31.5–35.7)
MCV: 93 fL (ref 79–97)
Platelets: 269 10*3/uL (ref 150–450)
RBC: 4.65 x10E6/uL (ref 3.77–5.28)
RDW: 12.8 % (ref 11.7–15.4)
WBC: 6.2 10*3/uL (ref 3.4–10.8)

## 2019-04-24 LAB — CMP14+EGFR
ALT: 53 IU/L — ABNORMAL HIGH (ref 0–32)
AST: 32 IU/L (ref 0–40)
Albumin/Globulin Ratio: 1.8 (ref 1.2–2.2)
Albumin: 4.7 g/dL (ref 3.8–4.9)
Alkaline Phosphatase: 173 IU/L — ABNORMAL HIGH (ref 39–117)
BUN/Creatinine Ratio: 16 (ref 9–23)
BUN: 12 mg/dL (ref 6–24)
Bilirubin Total: 0.6 mg/dL (ref 0.0–1.2)
CO2: 26 mmol/L (ref 20–29)
Calcium: 9.6 mg/dL (ref 8.7–10.2)
Chloride: 99 mmol/L (ref 96–106)
Creatinine, Ser: 0.77 mg/dL (ref 0.57–1.00)
GFR calc Af Amer: 102 mL/min/{1.73_m2} (ref >59)
GFR calc non Af Amer: 88 mL/min/{1.73_m2} (ref >59)
Globulin, Total: 2.6 g/dL (ref 1.5–4.5)
Glucose: 80 mg/dL (ref 65–99)
Potassium: 3.7 mmol/L (ref 3.5–5.2)
Sodium: 143 mmol/L (ref 134–144)
Total Protein: 7.3 g/dL (ref 6.0–8.5)

## 2019-04-24 LAB — RHEUMATOID FACTOR: Rheumatoid fact SerPl-aCnc: <10 IU/mL (ref 0.0–13.9)

## 2019-04-24 LAB — TESTOSTERONE,FREE AND TOTAL
Testosterone, Free: 4.1 pg/mL (ref 0.0–4.2)
Testosterone: 35 ng/dL (ref 3–41)

## 2019-04-24 LAB — ANTINUCLEAR ANTIBODIES, IFA: ANA Titer 1: NEGATIVE

## 2019-04-24 LAB — LIPID PANEL
Chol/HDL Ratio: 2.9 ratio (ref 0.0–4.4)
Cholesterol, Total: 206 mg/dL — ABNORMAL HIGH (ref 100–199)
HDL: 71 mg/dL (ref >39)
LDL Calculated: 118 mg/dL — ABNORMAL HIGH (ref 0–99)
Triglycerides: 85 mg/dL (ref 0–149)
VLDL Cholesterol Cal: 17 mg/dL (ref 5–40)

## 2019-04-24 LAB — HEMOGLOBIN A1C
Est. average glucose Bld gHb Est-mCnc: 111 mg/dL
Hgb A1c MFr Bld: 5.5 % (ref 4.8–5.6)

## 2019-04-24 LAB — SEDIMENTATION RATE: Sed Rate: 29 mm/hr (ref 0–40)

## 2019-04-24 LAB — URIC ACID: Uric Acid: 5 mg/dL (ref 2.5–7.1)

## 2019-04-24 LAB — FOLLICLE STIMULATING HORMONE: FSH: 82.4 m[IU]/mL

## 2019-04-27 ENCOUNTER — Encounter: Payer: Self-pay | Admitting: Internal Medicine

## 2019-04-28 ENCOUNTER — Telehealth: Payer: Self-pay

## 2019-04-28 NOTE — Telephone Encounter (Signed)
-----   Message from Glendale Chard, MD sent at 04/27/2019  3:12 PM EDT ----- pls have her sign up for mychart. She does not have to add all of her personal info, I would like to be able to email her when needed. Her liver enzymes are slightly elevated, kidney fxn is nl. Blood count nl. LDL, bad chol is 118, not bad! Goal is less than 100. She is not prediabetic. Her [place] is 37, it appears she is officially postmenopausal. Testosterone level is upr limit of normal. All autoimmune arthritis tests are negative, including ANA which has been positive in the past.

## 2019-04-28 NOTE — Telephone Encounter (Signed)
Left the patient a message to call back for lab results. 

## 2019-08-21 ENCOUNTER — Other Ambulatory Visit: Payer: Self-pay | Admitting: Obstetrics and Gynecology

## 2019-08-21 DIAGNOSIS — Z1231 Encounter for screening mammogram for malignant neoplasm of breast: Secondary | ICD-10-CM

## 2019-09-14 ENCOUNTER — Telehealth: Payer: Self-pay

## 2019-09-14 NOTE — Telephone Encounter (Signed)
Pt called to let the provider know that she had a cycle

## 2019-10-06 ENCOUNTER — Other Ambulatory Visit: Payer: Self-pay | Admitting: Obstetrics and Gynecology

## 2019-12-28 ENCOUNTER — Other Ambulatory Visit: Payer: Self-pay | Admitting: Internal Medicine

## 2020-02-08 ENCOUNTER — Telehealth: Payer: Self-pay

## 2020-02-08 NOTE — Telephone Encounter (Signed)
LVM, and Mychart message for pt to call the office to have appt that's scheduled in May changed. Office is no longer open on Fridays

## 2020-03-07 ENCOUNTER — Telehealth: Payer: Self-pay

## 2020-03-07 NOTE — Telephone Encounter (Signed)
-----   Message from Glendale Chard, MD sent at 03/03/2020  5:50 PM EDT ----- Please write out rx and fax to the patient. Does she need letter? Ask her exactly what she needs it to say and write it please. Thanks ----- Message ----- From: Michelle Nasuti, [place] Sent: 03/03/2020   4:32 PM EDT To: Glendale Chard, MD  The patient called and said that her job would buy her an ergonomic chair to go with her desk because the pt said that she was diagnosed with frozen shoulder at her last visit but that she has to get a note from her doctor.

## 2020-03-07 NOTE — Telephone Encounter (Signed)
The patient said that she needed a letter and that she would call back she wants to check with her HR department first to make sure of what is needed in the letter.

## 2020-04-11 LAB — HM PAP SMEAR: HM Pap smear: POSITIVE

## 2020-04-22 ENCOUNTER — Encounter: Payer: BLUE CROSS/BLUE SHIELD | Admitting: Internal Medicine

## 2020-04-25 ENCOUNTER — Encounter: Payer: BLUE CROSS/BLUE SHIELD | Admitting: Internal Medicine

## 2020-04-26 ENCOUNTER — Encounter: Payer: Self-pay | Admitting: Internal Medicine

## 2020-04-26 ENCOUNTER — Ambulatory Visit (INDEPENDENT_AMBULATORY_CARE_PROVIDER_SITE_OTHER): Payer: BC Managed Care – PPO | Admitting: Internal Medicine

## 2020-04-26 ENCOUNTER — Other Ambulatory Visit: Payer: Self-pay

## 2020-04-26 VITALS — BP 126/70 | HR 75 | Temp 98.1°F | Ht 65.2 in | Wt 179.8 lb

## 2020-04-26 DIAGNOSIS — Z0000 Encounter for general adult medical examination without abnormal findings: Secondary | ICD-10-CM

## 2020-04-26 DIAGNOSIS — H6123 Impacted cerumen, bilateral: Secondary | ICD-10-CM

## 2020-04-26 DIAGNOSIS — R748 Abnormal levels of other serum enzymes: Secondary | ICD-10-CM | POA: Diagnosis not present

## 2020-04-26 NOTE — Patient Instructions (Addendum)
Dr. Renda Rolls - dermatologist  Health Maintenance, Female Adopting a healthy lifestyle and getting preventive care are important in promoting health and wellness. Ask your health care provider about:  The right schedule for you to have regular tests and exams.  Things you can do on your own to prevent diseases and keep yourself healthy. What should I know about diet, weight, and exercise? Eat a healthy diet   Eat a diet that includes plenty of vegetables, fruits, low-fat dairy products, and lean protein.  Do not eat a lot of foods that are high in solid fats, added sugars, or sodium. Maintain a healthy weight Body mass index (BMI) is used to identify weight problems. It estimates body fat based on height and weight. Your health care provider can help determine your BMI and help you achieve or maintain a healthy weight. Get regular exercise Get regular exercise. This is one of the most important things you can do for your health. Most adults should:  Exercise for at least 150 minutes each week. The exercise should increase your heart rate and make you sweat (moderate-intensity exercise).  Do strengthening exercises at least twice a week. This is in addition to the moderate-intensity exercise.  Spend less time sitting. Even light physical activity can be beneficial. Watch cholesterol and blood lipids Have your blood tested for lipids and cholesterol at 54 years of age, then have this test every 5 years. Have your cholesterol levels checked more often if:  Your lipid or cholesterol levels are high.  You are older than 54 years of age.  You are at high risk for heart disease. What should I know about cancer screening? Depending on your health history and family history, you may need to have cancer screening at various ages. This may include screening for:  Breast cancer.  Cervical cancer.  Colorectal cancer.  Skin cancer.  Lung cancer. What should I know about heart  disease, diabetes, and high blood pressure? Blood pressure and heart disease  High blood pressure causes heart disease and increases the risk of stroke. This is more likely to develop in people who have high blood pressure readings, are of African descent, or are overweight.  Have your blood pressure checked: ? Every 3-5 years if you are 77-96 years of age. ? Every year if you are 41 years old or older. Diabetes Have regular diabetes screenings. This checks your fasting blood sugar level. Have the screening done:  Once every three years after age 46 if you are at a normal weight and have a low risk for diabetes.  More often and at a younger age if you are overweight or have a high risk for diabetes. What should I know about preventing infection? Hepatitis B If you have a higher risk for hepatitis B, you should be screened for this virus. Talk with your health care provider to find out if you are at risk for hepatitis B infection. Hepatitis C Testing is recommended for:  Everyone born from [birth year] through 1965.  Anyone with known risk factors for hepatitis C. Sexually transmitted infections (STIs)  Get screened for STIs, including gonorrhea and chlamydia, if: ? You are sexually active and are younger than 54 years of age. ? You are older than 54 years of age and your health care provider tells you that you are at risk for this type of infection. ? Your sexual activity has changed since you were last screened, and you are at increased risk for chlamydia or gonorrhea. Ask  your health care provider if you are at risk.  Ask your health care provider about whether you are at high risk for HIV. Your health care provider may recommend a prescription medicine to help prevent HIV infection. If you choose to take medicine to prevent HIV, you should first get tested for HIV. You should then be tested every 3 months for as long as you are taking the medicine. Pregnancy  If you are about to stop  having your period (premenopausal) and you may become pregnant, seek counseling before you get pregnant.  Take 400 to 800 micrograms (mcg) of folic acid every day if you become pregnant.  Ask for birth control (contraception) if you want to prevent pregnancy. Osteoporosis and menopause Osteoporosis is a disease in which the bones lose minerals and strength with aging. This can result in bone fractures. If you are 61 years old or older, or if you are at risk for osteoporosis and fractures, ask your health care provider if you should:  Be screened for bone loss.  Take a calcium or vitamin D supplement to lower your risk of fractures.  Be given hormone replacement therapy (HRT) to treat symptoms of menopause. Follow these instructions at home: Lifestyle  Do not use any products that contain nicotine or tobacco, such as cigarettes, e-cigarettes, and chewing tobacco. If you need help quitting, ask your health care provider.  Do not use street drugs.  Do not share needles.  Ask your health care provider for help if you need support or information about quitting drugs. Alcohol use  Do not drink alcohol if: ? Your health care provider tells you not to drink. ? You are pregnant, may be pregnant, or are planning to become pregnant.  If you drink alcohol: ? Limit how much you use to 0-1 drink a day. ? Limit intake if you are breastfeeding.  Be aware of how much alcohol is in your drink. In the [place] one drink equals one 12 oz bottle of beer (355 mL), one 5 oz glass of wine (148 mL), or one 1 oz glass of hard liquor (44 mL). General instructions  Schedule regular health, dental, and eye exams.  Stay current with your vaccines.  Tell your health care provider if: ? You often feel depressed. ? You have ever been abused or do not feel safe at home. Summary  Adopting a healthy lifestyle and getting preventive care are important in promoting health and wellness.  Follow your health  care provider's instructions about healthy diet, exercising, and getting tested or screened for diseases.  Follow your health care provider's instructions on monitoring your cholesterol and blood pressure. This information is not intended to replace advice given to you by your health care provider. Make sure you discuss any questions you have with your health care provider. Document Revised: 11/26/2018 Document Reviewed: 11/26/2018 Elsevier Patient Education  2020 [employer].

## 2020-04-27 LAB — CBC
Hematocrit: 42.3 % (ref 34.0–46.6)
Hemoglobin: 14 g/dL (ref 11.1–15.9)
MCH: 29.7 pg (ref 26.6–33.0)
MCHC: 33.1 g/dL (ref 31.5–35.7)
MCV: 90 fL (ref 79–97)
Platelets: 299 10*3/uL (ref 150–450)
RBC: 4.71 x10E6/uL (ref 3.77–5.28)
RDW: 13.3 % (ref 11.7–15.4)
WBC: 6.3 10*3/uL (ref 3.4–10.8)

## 2020-04-27 LAB — LIPID PANEL
Chol/HDL Ratio: 3.8 ratio (ref 0.0–4.4)
Cholesterol, Total: 199 mg/dL (ref 100–199)
HDL: 53 mg/dL (ref >39)
LDL Chol Calc (NIH): 131 mg/dL — ABNORMAL HIGH (ref 0–99)
Triglycerides: 85 mg/dL (ref 0–149)
VLDL Cholesterol Cal: 15 mg/dL (ref 5–40)

## 2020-04-27 LAB — CMP14+EGFR
ALT: 34 IU/L — ABNORMAL HIGH (ref 0–32)
AST: 29 IU/L (ref 0–40)
Albumin/Globulin Ratio: 1.5 (ref 1.2–2.2)
Albumin: 4.6 g/dL (ref 3.8–4.9)
Alkaline Phosphatase: 212 IU/L — ABNORMAL HIGH (ref 39–117)
BUN/Creatinine Ratio: 9 (ref 9–23)
BUN: 7 mg/dL (ref 6–24)
Bilirubin Total: 0.5 mg/dL (ref 0.0–1.2)
CO2: 25 mmol/L (ref 20–29)
Calcium: 9.4 mg/dL (ref 8.7–10.2)
Chloride: 102 mmol/L (ref 96–106)
Creatinine, Ser: 0.76 mg/dL (ref 0.57–1.00)
GFR calc Af Amer: 103 mL/min/{1.73_m2} (ref >59)
GFR calc non Af Amer: 89 mL/min/{1.73_m2} (ref >59)
Globulin, Total: 3 g/dL (ref 1.5–4.5)
Glucose: 102 mg/dL — ABNORMAL HIGH (ref 65–99)
Potassium: 4 mmol/L (ref 3.5–5.2)
Sodium: 142 mmol/L (ref 134–144)
Total Protein: 7.6 g/dL (ref 6.0–8.5)

## 2020-04-27 LAB — GAMMA GT: GGT: 234 IU/L — ABNORMAL HIGH (ref 0–60)

## 2020-04-27 LAB — HEMOGLOBIN A1C
Est. average glucose Bld gHb Est-mCnc: 117 mg/dL
Hgb A1c MFr Bld: 5.7 % — ABNORMAL HIGH (ref 4.8–5.6)

## 2020-05-02 NOTE — Progress Notes (Signed)
This visit occurred during the SARS-CoV-2 public health emergency.  Safety protocols were in place, including screening questions prior to the visit, additional usage of staff PPE, and extensive cleaning of exam room while observing appropriate contact time as indicated for disinfecting solutions.  Subjective:     Patient ID: Sheryl [name] , female    DOB: [birth year] , 54 y.o.   MRN: [id number]   Chief Complaint  Patient presents with  . Annual Exam    HPI  She is here today for a full physical exam. She is now followed by Dr. [name] for her GYN exams.  She wants to discuss previous w/u for abnormal liver enzymes. She has had GI workup in the past, no definitive cause for elevated LFTs was found.     Past Medical History:  Diagnosis Date  . Dysmenorrhea   . Endocervical polyp   . Fibroids   . GERD (gastroesophageal reflux disease)    diet controlled  . H/O varicella   . History of chicken pox   . History of measles   . History of measles, mumps, or rubella   . History of mumps   . Menorrhagia   . Nocturia   . Unstable bladder   . Urinary frequency   . Yeast infection      Family History  Problem Relation Age of Onset  . Diverticulitis Mother   . Diabetes Father   . Diabetes Paternal Grandfather      Current Outpatient Medications:  .  Ascorbic Acid (VITAMIN C PO), Take by mouth., Disp: , Rfl:  .  Biotin w/ Vitamins C & E (HAIR/SKIN/NAILS PO), Take by mouth., Disp: , Rfl:  .  Calcium Carbonate-Vitamin D (CALCARB 600/D) 600-400 MG-UNIT per tablet, Take 1 tablet by mouth daily., Disp: , Rfl:  .  Cholecalciferol (VITAMIN D3 PO), 5,000 Units daily. , Disp: , Rfl:  .  Cyanocobalamin (B-12 PO), Take by mouth., Disp: , Rfl:  .  EPINEPHrine (EPIPEN 2-PAK) 0.3 mg/0.3 mL IJ SOAJ injection, EpiPen 2-Pak 0.3 mg/0.3 mL injection, auto-injector, Disp: , Rfl:  .  fluticasone (FLONASE) 50 MCG/ACT nasal spray, fluticasone propionate 50 mcg/actuation nasal spray,suspension, Disp:  , Rfl:  .  ibuprofen (ADVIL,MOTRIN) 600 MG tablet, 1 po pc q 6 hours x 5 days then prn-pain, Disp: 30 tablet, Rfl: 1 .  levocetirizine (XYZAL) 5 MG tablet, Take 1 tablet (5 mg total) by mouth every evening., Disp: 90 tablet, Rfl: 1 .  MILK THISTLE PO, Take by mouth., Disp: , Rfl:  .  Multiple Vitamins-Minerals (MULTIVITAMIN ADULTS PO), multivitamin, Disp: , Rfl:  .  Probiotic Product (RESTORA) CAPS, TAKE ONE CAPSULE BY MOUTH EVERY DAY, Disp: 30 capsule, Rfl: 5 .  TURMERIC PO, Take by mouth., Disp: , Rfl:  .  BLACK COHOSH EXTRACT PO, Take by mouth., Disp: , Rfl:    Allergies  Allergen Reactions  . Almond (Diagnostic) Anaphylaxis  . Cat Hair Extract   . Milk-Related Compounds      The patient states she uses none for birth control. Last LMP was No LMP recorded. Patient is perimenopausal.. Negative for Dysmenorrhea  Negative for: breast discharge, breast lump(s), breast pain and breast self exam. Associated symptoms include abnormal vaginal bleeding. Pertinent negatives include abnormal bleeding (hematology), anxiety, decreased libido, depression, difficulty falling sleep, dyspareunia, history of infertility, nocturia, sexual dysfunction, sleep disturbances, urinary incontinence, urinary urgency, vaginal discharge and vaginal itching. Diet regular.The patient states her exercise level is  intermittent.   . The patient's tobacco  use is:  Social History   Tobacco Use  Smoking Status Never Smoker  Smokeless Tobacco Never Used  . She has been exposed to passive smoke. The patient's alcohol use is:  Social History   Substance and Sexual Activity  Alcohol Use Yes    Review of Systems  Constitutional: Negative.   HENT: Negative.   Eyes: Negative.   Respiratory: Negative.   Cardiovascular: Negative.   Endocrine: Negative.   Genitourinary: Negative.   Musculoskeletal: Negative.   Skin: Negative.   Allergic/Immunologic: Negative.   Neurological: Negative.   Hematological: Negative.    Psychiatric/Behavioral: Negative.      Today's Vitals   04/26/20 0915  BP: 126/70  Pulse: 75  Temp: 98.1 F (36.7 C)  TempSrc: Oral  Weight: 179 lb 12.8 oz (81.6 kg)  Height: 5' 5.2" (1.656 m)   Body mass index is 29.74 kg/m.   Objective:  Physical Exam Vitals and nursing note reviewed.  Constitutional:      Appearance: Normal appearance.  HENT:     Head: Normocephalic and atraumatic.     Right Ear: Ear canal and external ear normal. There is impacted cerumen.     Left Ear: Ear canal and external ear normal. There is impacted cerumen.     Ears:     Comments: Hard, impacted cerumen b/l ears    Nose:     Comments: Deferred, masked    Mouth/Throat:     Comments: Deferred, masked Eyes:     Extraocular Movements: Extraocular movements intact.     Conjunctiva/sclera: Conjunctivae normal.     Pupils: Pupils are equal, round, and reactive to light.  Cardiovascular:     Rate and Rhythm: Normal rate and regular rhythm.     Pulses: Normal pulses.     Heart sounds: Normal heart sounds.  Pulmonary:     Effort: Pulmonary effort is normal.     Breath sounds: Normal breath sounds.  Chest:     Breasts: Tanner Score is 5.        Right: Normal.        Left: Normal.  Abdominal:     General: Abdomen is flat. Bowel sounds are normal.     Palpations: Abdomen is soft.  Genitourinary:    Comments: deferred Musculoskeletal:        General: Normal range of motion.     Cervical back: Normal range of motion and neck supple.  Skin:    General: Skin is warm and dry.  Neurological:     General: No focal deficit present.     Mental Status: She is alert and oriented to person, place, and time.  Psychiatric:        Mood and Affect: Mood normal.        Behavior: Behavior normal.         Assessment And Plan:     1. Routine general medical examination at health care facility  A full exam was performed. Importance of monthly self breast exams was discussed with the patient. PATIENT  IS ADVISED TO GET 30-45 MINUTES REGULAR EXERCISE NO LESS THAN FOUR TO FIVE DAYS PER WEEK - BOTH WEIGHTBEARING EXERCISES AND AEROBIC ARE RECOMMENDED.  SHE IS ADVISED TO FOLLOW A HEALTHY DIET WITH AT LEAST SIX FRUITS/VEGGIES PER DAY, DECREASE INTAKE OF RED MEAT, AND TO INCREASE FISH INTAKE TO TWO DAYS PER WEEK.  MEATS/FISH SHOULD NOT BE FRIED, BAKED OR BROILED IS PREFERABLE.  I SUGGEST WEARING SPF 50 SUNSCREEN ON EXPOSED PARTS AND ESPECIALLY  WHEN IN THE DIRECT SUNLIGHT FOR AN EXTENDED PERIOD OF TIME.  PLEASE AVOID FAST FOOD RESTAURANTS AND INCREASE YOUR WATER INTAKE.  - CMP14+EGFR - CBC - Lipid panel - Hemoglobin A1c - Insulin, random(561)  2. Bilateral impacted cerumen  AFTER OBTAINING VERBAL CONSENT, BOTH EARS WERE  FLUSHED BY IRRIGATION. SHE TOLERATED PROCEDURE WELL WITHOUT ANY COMPLICATIONS. NO TM ABNORMALITIES WERE NOTED.   3. Abnormal liver enzymes  Chronic. I will check repeat LFTs today. I will check GGT today. I reviewed most recent RUQ ultrasound from 02/2017 during her visit. No abnormality was noted at that time. I will refer her to Hepatology as requested.   - Gamma GT, GGT (89022)      [name], MD    THE PATIENT IS ENCOURAGED TO PRACTICE SOCIAL DISTANCING DUE TO THE COVID-19 PANDEMIC.

## 2020-05-04 LAB — ALKALINE PHOSPHATASE, ISOENZYMES
Alkaline Phosphatase: 212 IU/L — ABNORMAL HIGH (ref 39–117)
BONE FRACTION: 32 % (ref 14–68)
INTESTINAL FRAC.: 5 % (ref 0–18)
LIVER FRACTION: 63 % (ref 18–85)

## 2020-05-04 LAB — SPECIMEN STATUS REPORT

## 2020-05-10 ENCOUNTER — Other Ambulatory Visit: Payer: Self-pay | Admitting: Internal Medicine

## 2020-05-10 DIAGNOSIS — L659 Nonscarring hair loss, unspecified: Secondary | ICD-10-CM

## 2020-06-09 ENCOUNTER — Other Ambulatory Visit: Payer: Self-pay | Admitting: Nurse Practitioner

## 2020-06-09 DIAGNOSIS — K7469 Other cirrhosis of liver: Secondary | ICD-10-CM

## 2020-06-22 ENCOUNTER — Ambulatory Visit
Admission: RE | Admit: 2020-06-22 | Discharge: 2020-06-22 | Disposition: A | Discharge status: Home / Self Care | Payer: BC Managed Care – PPO | Source: Ambulatory Visit | Attending: Nurse Practitioner | Admitting: Nurse Practitioner

## 2020-06-22 DIAGNOSIS — K7469 Other cirrhosis of liver: Secondary | ICD-10-CM

## 2020-06-30 ENCOUNTER — Other Ambulatory Visit: Payer: Self-pay | Admitting: Nurse Practitioner

## 2020-06-30 DIAGNOSIS — K831 Obstruction of bile duct: Secondary | ICD-10-CM

## 2020-06-30 DIAGNOSIS — R748 Abnormal levels of other serum enzymes: Secondary | ICD-10-CM

## 2020-07-26 ENCOUNTER — Other Ambulatory Visit: Payer: Self-pay

## 2020-07-26 ENCOUNTER — Ambulatory Visit
Admission: RE | Admit: 2020-07-26 | Discharge: 2020-07-26 | Disposition: A | Discharge status: Home / Self Care | Payer: BC Managed Care – PPO | Source: Ambulatory Visit | Attending: Nurse Practitioner | Admitting: Nurse Practitioner

## 2020-07-26 DIAGNOSIS — K831 Obstruction of bile duct: Secondary | ICD-10-CM

## 2020-07-26 DIAGNOSIS — R748 Abnormal levels of other serum enzymes: Secondary | ICD-10-CM

## 2020-07-26 MED ORDER — GADOBENATE DIMEGLUMINE 529 MG/ML IV SOLN
16.0000 mL | Freq: Once | INTRAVENOUS | Status: AC | PRN
  Administered 2020-07-26: 16 mL via INTRAVENOUS

## 2020-08-08 ENCOUNTER — Other Ambulatory Visit: Payer: Self-pay

## 2020-08-08 MED ORDER — EPINEPHRINE 0.3 MG/0.3ML IJ SOAJ: INTRAMUSCULAR | 2 refills | Status: DC

## 2020-09-15 LAB — HM MAMMOGRAPHY

## 2020-11-07 LAB — CBC AND DIFFERENTIAL
HCT: 43 (ref 36–46)
Hemoglobin: 15 (ref 12.0–16.0)
Neutrophils Absolute: 45
Platelets: 313 (ref 150–399)
WBC: 6.4

## 2020-11-07 LAB — LIPID PANEL
Cholesterol: 236 — AB (ref 0–200)
HDL: 59 (ref 35–70)
LDL Cholesterol: 18
Triglycerides: 100 (ref 40–160)

## 2020-11-07 LAB — HEPATIC FUNCTION PANEL
ALT: 27 (ref 7–35)
AST: 24 (ref 13–35)
Alkaline Phosphatase: 204 — AB (ref 25–125)
Bilirubin, Total: 0.6

## 2020-11-07 LAB — BASIC METABOLIC PANEL
BUN: 10 (ref 4–21)
Chloride: 101 (ref 99–108)
Creatinine: 0.7 (ref 0.5–1.1)
Glucose: 110
Potassium: 4.3 (ref 3.4–5.3)
Sodium: 141 (ref 137–147)

## 2020-11-07 LAB — CBC: RBC: 4.87 (ref 3.87–5.11)

## 2020-11-07 LAB — COMPREHENSIVE METABOLIC PANEL
Albumin: 4.8 (ref 3.5–5.0)
Calcium: 10.1 (ref 8.7–10.7)
Globulin: 2.8

## 2020-12-22 ENCOUNTER — Other Ambulatory Visit: Payer: BC Managed Care – PPO

## 2020-12-22 ENCOUNTER — Other Ambulatory Visit: Payer: Self-pay

## 2020-12-22 DIAGNOSIS — Z20822 Contact with and (suspected) exposure to covid-19: Secondary | ICD-10-CM

## 2020-12-24 LAB — SARS-COV-2, NAA 2 DAY TAT

## 2020-12-24 LAB — NOVEL CORONAVIRUS, NAA: SARS-CoV-2, NAA: NOT DETECTED

## 2021-05-02 ENCOUNTER — Other Ambulatory Visit: Payer: Self-pay

## 2021-05-02 ENCOUNTER — Ambulatory Visit (INDEPENDENT_AMBULATORY_CARE_PROVIDER_SITE_OTHER): Payer: BC Managed Care – PPO | Admitting: Internal Medicine

## 2021-05-02 ENCOUNTER — Encounter: Payer: Self-pay | Admitting: Internal Medicine

## 2021-05-02 VITALS — BP 126/84 | HR 80 | Temp 98.2°F | Ht 65.8 in | Wt 166.4 lb

## 2021-05-02 DIAGNOSIS — H6121 Impacted cerumen, right ear: Secondary | ICD-10-CM | POA: Diagnosis not present

## 2021-05-02 DIAGNOSIS — F419 Anxiety disorder, unspecified: Secondary | ICD-10-CM | POA: Diagnosis not present

## 2021-05-02 DIAGNOSIS — Z0000 Encounter for general adult medical examination without abnormal findings: Secondary | ICD-10-CM

## 2021-05-02 MED ORDER — FLUTICASONE PROPIONATE 50 MCG/ACT NA SUSP: NASAL | 1 refills | Status: DC

## 2021-05-02 MED ORDER — RESTORA PO CAPS: ORAL_CAPSULE | ORAL | 5 refills | Status: DC

## 2021-05-02 MED ORDER — LEVOCETIRIZINE DIHYDROCHLORIDE 5 MG PO TABS: 5.0000 mg | ORAL_TABLET | Freq: Every evening | ORAL | 1 refills | Status: DC

## 2021-05-02 NOTE — Progress Notes (Signed)
I,Katawbba Wiggins,acting as a Education administrator for [name], MD.,have documented all relevant documentation on the behalf of [name], MD,as directed by  [name], MD while in the presence of [name], MD.  This visit occurred during the SARS-CoV-2 public health emergency.  Safety protocols were in place, including screening questions prior to the visit, additional usage of staff PPE, and extensive cleaning of exam room while observing appropriate contact time as indicated for disinfecting solutions.  Subjective:     Patient ID: Sheryl [name] , female    DOB: [birth year] , 55 y.o.   MRN: [id number]   Chief Complaint  Patient presents with  . Annual Exam    HPI  She is here today for a full physical exam. She is now followed by Dr. [name] for her GYN exams.  Last pap was 03/2020.  She has been evaluated for elevated LFTs by liver specialist. She reports she is under a great deal of stress at work. 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 She reports the merge has been quite stressful. Sx started many months ago. However, her sx have progressed since January. She is now having difficulty managing it. 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    Anxiety Presents for initial visit. The problem has been gradually worsening. Symptoms include excessive worry, irritability, nervous/anxious behavior and palpitations. Patient reports no depressed mood. The severity of symptoms is interfering with daily activities. The symptoms are aggravated by work stress.       Past Medical History:  Diagnosis Date  . Dysmenorrhea   . Endocervical polyp   . Fibroids   . GERD (gastroesophageal reflux disease)    diet controlled  . H/O varicella   . History of chicken pox   . History of measles   . History of measles, mumps, or rubella   . History of mumps   . Menorrhagia    . Nocturia   . Unstable bladder   . Urinary frequency   . Yeast infection      Family History  Problem Relation Age of Onset  . Diverticulitis Mother   . Diabetes Father   . Diabetes Paternal Grandfather      Current Outpatient Medications:  .  Ascorbic Acid (VITAMIN C PO), Take by mouth., Disp: , Rfl:  .  Biotin w/ Vitamins C & E (HAIR/SKIN/NAILS PO), Take by mouth., Disp: , Rfl:  .  Calcium Carbonate-Vitamin D 600-400 MG-UNIT tablet, Take 1 tablet by mouth daily., Disp: , Rfl:  .  Cholecalciferol (VITAMIN D3 PO), 5,000 Units daily. , Disp: , Rfl:  .  Cyanocobalamin (B-12 PO), Take by mouth., Disp: , Rfl:  .  EPINEPHrine (EPIPEN 2-PAK) 0.3 mg/0.3 mL IJ SOAJ injection, EpiPen 2-Pak 0.3 mg/0.3 mL injection, auto-injector, Disp: 1 each, Rfl: 2 .  ibuprofen (ADVIL,MOTRIN) 600 MG tablet, 1 po pc q 6 hours x 5 days then prn-pain, Disp: 30 tablet, Rfl: 1 .  MILK THISTLE PO, Take by mouth., Disp: , Rfl:  .  Multiple Vitamins-Minerals (MULTIVITAMIN ADULTS PO), multivitamin, Disp: , Rfl:  .  TURMERIC PO, Take by mouth., Disp: , Rfl:  .  fluticasone (FLONASE) 50 MCG/ACT nasal spray, 2 sprays via each nostril daily, Disp: 16 g, Rfl: 1 .  levocetirizine (XYZAL) 5 MG tablet, Take 1 tablet (5 mg total) by mouth every evening., Disp: 90 tablet, Rfl: 1 .  Probiotic Product (RESTORA) CAPS, TAKE ONE CAPSULE BY MOUTH EVERY DAY, Disp: 30 capsule, Rfl: 5   Allergies  Allergen Reactions  . Almond (Diagnostic) Anaphylaxis  . Cat Hair Extract   . Milk-Related Compounds       The patient states she uses none for birth control. Last LMP was No LMP recorded. Patient is perimenopausal.. Negative for Dysmenorrhea. Negative for: breast discharge, breast lump(s), breast pain and breast self exam. Associated symptoms include abnormal vaginal bleeding. Pertinent negatives include abnormal bleeding (hematology), anxiety, decreased libido, depression, difficulty falling sleep, dyspareunia, history of  infertility, nocturia, sexual dysfunction, sleep disturbances, urinary incontinence, urinary urgency, vaginal discharge and vaginal itching. Diet regular.The patient states her exercise level is  intermittent.  . The patient's tobacco use is:  Social History   Tobacco Use  Smoking Status Never Smoker  Smokeless Tobacco Never Used  . She has been exposed to passive smoke. The patient's alcohol use is:  Social History   Substance and Sexual Activity  Alcohol Use Yes   Review of Systems  Constitutional: Positive for irritability.  HENT: Negative.   Eyes: Negative.   Respiratory: Negative.   Cardiovascular: Positive for palpitations.  Gastrointestinal: Negative.   Endocrine: Negative.   Genitourinary: Negative.   Musculoskeletal: Negative.   Skin: Negative.   Allergic/Immunologic: Negative.   Neurological: Negative.   Hematological: Negative.   Psychiatric/Behavioral: The patient is nervous/anxious.      Today's Vitals   05/02/21 0911  BP: 126/84  Pulse: 80  Temp: 98.2 F (36.8 C)  TempSrc: Oral  Weight: 166 lb 6.4 oz (75.5 kg)  Height: 5' 5.8" (1.671 m)   Body mass index is 27.02 kg/m.  Wt Readings from Last 3 Encounters:  05/02/21 166 lb 6.4 oz (75.5 kg)  04/26/20 179 lb 12.8 oz (81.6 kg)  04/22/19 163 lb 6.4 oz (74.1 kg)   BP Readings from Last 3 Encounters:  05/02/21 126/84  04/26/20 126/70  04/22/19 122/80   Objective:  Physical Exam Vitals and nursing note reviewed.  Constitutional:      Appearance: Normal appearance.  HENT:     Head: Normocephalic and atraumatic.     Right Ear: Ear canal and external ear normal. There is impacted cerumen.     Left Ear: Tympanic membrane, ear canal and external ear normal.     Nose:     Comments: Masked     Mouth/Throat:     Comments: Masked  Eyes:     Extraocular Movements: Extraocular movements intact.     Conjunctiva/sclera: Conjunctivae normal.     Pupils: Pupils are equal, round, and reactive to light.   Cardiovascular:     Rate and Rhythm: Normal rate and regular rhythm.     Pulses: Normal pulses.     Heart sounds: Normal heart sounds.  Pulmonary:     Effort: Pulmonary effort is normal.     Breath sounds: Normal breath sounds.  Chest:  Breasts:     Tanner Score is 5.     Right: Normal.     Left: Normal.    Abdominal:     General: Bowel sounds are normal.     Palpations: Abdomen is soft.  Genitourinary:    Comments: deferred Musculoskeletal:        General: Normal range of motion.     Cervical back: Normal range of motion and neck supple.  Skin:    General: Skin is warm and dry.  Neurological:     General: No focal deficit  present.     Mental Status: She is alert and oriented to person, place, and time.  Psychiatric:        Mood and Affect: Mood normal.        Behavior: Behavior normal.         Assessment And Plan:     1. Routine general medical examination at health care facility Comments: A full exam was performed. Importance of monthly self breast exams was discussed with the patient. PATIENT IS ADVISED TO GET 30-45 MINUTES REGULAR EXERCISE NO LESS THAN FOUR TO FIVE DAYS PER WEEK - BOTH WEIGHTBEARING EXERCISES AND AEROBIC ARE RECOMMENDED.  PATIENT IS ADVISED TO FOLLOW A HEALTHY DIET WITH AT LEAST SIX FRUITS/VEGGIES PER DAY, DECREASE INTAKE OF RED MEAT, AND TO INCREASE FISH INTAKE TO TWO DAYS PER WEEK.  MEATS/FISH SHOULD NOT BE FRIED, BAKED OR BROILED IS PREFERABLE.  IT IS ALSO IMPORTANT TO CUT BACK ON YOUR SUGAR INTAKE. PLEASE AVOID ANYTHING WITH ADDED SUGAR, CORN SYRUP OR OTHER SWEETENERS. IF YOU MUST USE A SWEETENER, YOU CAN TRY STEVIA. IT IS ALSO IMPORTANT TO AVOID ARTIFICIALLY SWEETENERS AND DIET BEVERAGES. LASTLY, I SUGGEST WEARING SPF 50 SUNSCREEN ON EXPOSED PARTS AND ESPECIALLY WHEN IN THE DIRECT SUNLIGHT FOR AN EXTENDED PERIOD OF TIME.  PLEASE AVOID FAST FOOD RESTAURANTS AND INCREASE YOUR WATER INTAKE.  - Hemoglobin A1c - CBC - Lipid panel - BMP8+EGFR -  Hepatitis C antibody - Vitamin B12  2. Right ear impacted cerumen AFTER OBTAINING VERBAL CONSENT, RIGHT EAR WAS FLUSHED BY IRRIGATION. SHE TOLERATED PROCEDURE WELL WITHOUT ANY COMPLICATIONS. NO TM ABNORMALITIES WERE NOTED.  - Ear Lavage  3. Anxiety Comments: I have advised her to start magnesium nightly and take L-theanine daily. She has appt scheduled w/ therapist already. I will take her out of work from 5/23-7/1. She is advised to touch base with me in 1-2 weeks. If no improvement in her sx,  - TSH - Magnesium  Patient was given opportunity to ask questions. Patient verbalized understanding of the plan and was able to repeat key elements of the plan. All questions were answered to their satisfaction.   I, [name], MD, have reviewed all documentation for this visit. The documentation on 05/02/21 for the exam, diagnosis, procedures, and orders are all accurate and complete.  THE PATIENT IS ENCOURAGED TO PRACTICE SOCIAL DISTANCING DUE TO THE COVID-19 PANDEMIC.

## 2021-05-02 NOTE — Patient Instructions (Addendum)
Calm, magnesium nightly   L-theanine  - use as directed for anxiety  Health Maintenance, Female Adopting a healthy lifestyle and getting preventive care are important in promoting health and wellness. Ask your health care provider about:  The right schedule for you to have regular tests and exams.  Things you can do on your own to prevent diseases and keep yourself healthy. What should I know about diet, weight, and exercise? Eat a healthy diet  Eat a diet that includes plenty of vegetables, fruits, low-fat dairy products, and lean protein.  Do not eat a lot of foods that are high in solid fats, added sugars, or sodium.   Maintain a healthy weight Body mass index (BMI) is used to identify weight problems. It estimates body fat based on height and weight. Your health care provider can help determine your BMI and help you achieve or maintain a healthy weight. Get regular exercise Get regular exercise. This is one of the most important things you can do for your health. Most adults should:  Exercise for at least 150 minutes each week. The exercise should increase your heart rate and make you sweat (moderate-intensity exercise).  Do strengthening exercises at least twice a week. This is in addition to the moderate-intensity exercise.  Spend less time sitting. Even light physical activity can be beneficial. Watch cholesterol and blood lipids Have your blood tested for lipids and cholesterol at 55 years of age, then have this test every 5 years. Have your cholesterol levels checked more often if:  Your lipid or cholesterol levels are high.  You are older than 55 years of age.  You are at high risk for heart disease. What should I know about cancer screening? Depending on your health history and family history, you may need to have cancer screening at various ages. This may include screening for:  Breast cancer.  Cervical cancer.  Colorectal cancer.  Skin cancer.  Lung  cancer. What should I know about heart disease, diabetes, and high blood pressure? Blood pressure and heart disease  High blood pressure causes heart disease and increases the risk of stroke. This is more likely to develop in people who have high blood pressure readings, are of African descent, or are overweight.  Have your blood pressure checked: ? Every 3-5 years if you are 56-50 years of age. ? Every year if you are 55 years old or older. Diabetes Have regular diabetes screenings. This checks your fasting blood sugar level. Have the screening done:  Once every three years after age 58 if you are at a normal weight and have a low risk for diabetes.  More often and at a younger age if you are overweight or have a high risk for diabetes. What should I know about preventing infection? Hepatitis B If you have a higher risk for hepatitis B, you should be screened for this virus. Talk with your health care provider to find out if you are at risk for hepatitis B infection. Hepatitis C Testing is recommended for:  Everyone born from [birth year] through 1965.  Anyone with known risk factors for hepatitis C. Sexually transmitted infections (STIs)  Get screened for STIs, including gonorrhea and chlamydia, if: ? You are sexually active and are younger than 55 years of age. ? You are older than 55 years of age and your health care provider tells you that you are at risk for this type of infection. ? Your sexual activity has changed since you were last screened, and  at increased risk for chlamydia or gonorrhea. Ask your health care provider if you are at risk.  Ask your health care provider about whether you are at high risk for HIV. Your health care provider may recommend a prescription medicine to help prevent HIV infection. If you choose to take medicine to prevent HIV, you should first get tested for HIV. You should then be tested every 3 months for as long as you are taking the  medicine. Pregnancy  If you are about to stop having your period (premenopausal) and you may become pregnant, seek counseling before you get pregnant.  Take 400 to 800 micrograms (mcg) of folic acid every day if you become pregnant.  Ask for birth control (contraception) if you want to prevent pregnancy. Osteoporosis and menopause Osteoporosis is a disease in which the bones lose minerals and strength with aging. This can result in bone fractures. If you are 65 years old or older, or if you are at risk for osteoporosis and fractures, ask your health care provider if you should:  Be screened for bone loss.  Take a calcium or vitamin D supplement to lower your risk of fractures.  Be given hormone replacement therapy (HRT) to treat symptoms of menopause. Follow these instructions at home: Lifestyle  Do not use any products that contain nicotine or tobacco, such as cigarettes, e-cigarettes, and chewing tobacco. If you need help quitting, ask your health care provider.  Do not use street drugs.  Do not share needles.  Ask your health care provider for help if you need support or information about quitting drugs. Alcohol use  Do not drink alcohol if: ? Your health care provider tells you not to drink. ? You are pregnant, may be pregnant, or are planning to become pregnant.  If you drink alcohol: ? Limit how much you use to 0-1 drink a day. ? Limit intake if you are breastfeeding.  Be aware of how much alcohol is in your drink. In the [place] one drink equals one 12 oz bottle of beer (355 mL), one 5 oz glass of wine (148 mL), or one 1 oz glass of hard liquor (44 mL). General instructions  Schedule regular health, dental, and eye exams.  Stay current with your vaccines.  Tell your health care provider if: ? You often feel depressed. ? You have ever been abused or do not feel safe at home. Summary  Adopting a healthy lifestyle and getting preventive care are important in  promoting health and wellness.  Follow your health care provider's instructions about healthy diet, exercising, and getting tested or screened for diseases.  Follow your health care provider's instructions on monitoring your cholesterol and blood pressure. This information is not intended to replace advice given to you by your health care provider. Make sure you discuss any questions you have with your health care provider. Document Revised: 11/26/2018 Document Reviewed: 11/26/2018 Elsevier Patient Education  2021 [employer] Inc.  

## 2021-05-03 LAB — CBC
Hematocrit: 44.4 % (ref 34.0–46.6)
Hemoglobin: 15.1 g/dL (ref 11.1–15.9)
MCH: 31.1 pg (ref 26.6–33.0)
MCHC: 34 g/dL (ref 31.5–35.7)
MCV: 92 fL (ref 79–97)
Platelets: 279 10*3/uL (ref 150–450)
RBC: 4.85 x10E6/uL (ref 3.77–5.28)
RDW: 13 % (ref 11.7–15.4)
WBC: 5.4 10*3/uL (ref 3.4–10.8)

## 2021-05-03 LAB — BMP8+EGFR
BUN/Creatinine Ratio: 12 (ref 9–23)
BUN: 10 mg/dL (ref 6–24)
CO2: 27 mmol/L (ref 20–29)
Calcium: 9.7 mg/dL (ref 8.7–10.2)
Chloride: 101 mmol/L (ref 96–106)
Creatinine, Ser: 0.81 mg/dL (ref 0.57–1.00)
Glucose: 104 mg/dL — ABNORMAL HIGH (ref 65–99)
Potassium: 4.2 mmol/L (ref 3.5–5.2)
Sodium: 147 mmol/L — ABNORMAL HIGH (ref 134–144)
eGFR: 86 mL/min/{1.73_m2} (ref >59)

## 2021-05-03 LAB — HEPATITIS C ANTIBODY: Hep C Virus Ab: <0.1 s/co ratio (ref 0.0–0.9)

## 2021-05-03 LAB — LIPID PANEL
Chol/HDL Ratio: 3.4 ratio (ref 0.0–4.4)
Cholesterol, Total: 230 mg/dL — ABNORMAL HIGH (ref 100–199)
HDL: 67 mg/dL (ref >39)
LDL Chol Calc (NIH): 149 mg/dL — ABNORMAL HIGH (ref 0–99)
Triglycerides: 80 mg/dL (ref 0–149)
VLDL Cholesterol Cal: 14 mg/dL (ref 5–40)

## 2021-05-03 LAB — HEMOGLOBIN A1C
Est. average glucose Bld gHb Est-mCnc: 117 mg/dL
Hgb A1c MFr Bld: 5.7 % — ABNORMAL HIGH (ref 4.8–5.6)

## 2021-05-03 LAB — MAGNESIUM: Magnesium: 2 mg/dL (ref 1.6–2.3)

## 2021-05-03 LAB — VITAMIN B12: Vitamin B-12: 831 pg/mL (ref 232–1245)

## 2021-05-03 LAB — TSH: TSH: 0.948 u[IU]/mL (ref 0.450–4.500)

## 2021-05-08 ENCOUNTER — Encounter: Payer: Self-pay | Admitting: Internal Medicine

## 2021-05-08 ENCOUNTER — Telehealth: Payer: Self-pay

## 2021-05-08 NOTE — Telephone Encounter (Signed)
The pt was notifeid that her leave of absence from has been completed, faxed, and emailed to the pt's email on file with the office.

## 2021-05-31 ENCOUNTER — Other Ambulatory Visit: Payer: Self-pay

## 2021-05-31 ENCOUNTER — Encounter: Payer: Self-pay | Admitting: Internal Medicine

## 2021-05-31 ENCOUNTER — Ambulatory Visit: Payer: BC Managed Care – PPO | Admitting: Internal Medicine

## 2021-05-31 VITALS — BP 112/76 | HR 87 | Temp 98.9°F | Ht 65.8 in | Wt 170.0 lb

## 2021-05-31 DIAGNOSIS — M778 Other enthesopathies, not elsewhere classified: Secondary | ICD-10-CM

## 2021-05-31 DIAGNOSIS — Z23 Encounter for immunization: Secondary | ICD-10-CM

## 2021-05-31 DIAGNOSIS — F5102 Adjustment insomnia: Secondary | ICD-10-CM

## 2021-05-31 DIAGNOSIS — M79675 Pain in left toe(s): Secondary | ICD-10-CM | POA: Diagnosis not present

## 2021-05-31 MED ORDER — SHINGRIX 50 MCG/0.5ML IM SUSR: 0.5000 mL | Freq: Once | INTRAMUSCULAR | 0 refills | Status: AC

## 2021-05-31 NOTE — Patient Instructions (Signed)
Melatonin - use nightly as needed for sleep  Wrist brace - please wear daily for next week

## 2021-05-31 NOTE — Progress Notes (Signed)
I,Katawbba Wiggins,acting as a Education administrator for [name], MD.,have documented all relevant documentation on the behalf of [name], MD,as directed by  [name], MD while in the presence of [name], MD.  This visit occurred during the SARS-CoV-2 public health emergency.  Safety protocols were in place, including screening questions prior to the visit, additional usage of staff PPE, and extensive cleaning of exam room while observing appropriate contact time as indicated for disinfecting solutions.  Subjective:     Patient ID: Sheryl [name] , female    DOB: 06/18/1966 , 55 y.o.   MRN: [id number]   Chief Complaint  Patient presents with   Wrist Pain    right   Toe Pain    Left pinky    HPI  The patient is here today for right wrist pain and left pinky toe pain. She reports she developed right wrist pain after her conceal/carry gun class on June 4th. She reports  "of course we did a lot of shooting" .  Later that evening, she developed right wrist pain.   Additionally, she has left 5th toe pain. She reports she bumped her toe against her ottoman last Tuesday, June 7th. She iced it for two days. She reports the swelling has improved some, but is concerned because she is still unable to put on sneakers.   Wrist Pain  The pain is present in the left toes and right wrist. This is a new problem. The current episode started 1 to 4 weeks ago. There has been no history of extremity trauma. The problem occurs intermittently. The problem has been gradually improving. The pain is at a severity of 5/10. The pain is moderate. Pertinent negatives include no fever. The symptoms are aggravated by activity.    Past Medical History:  Diagnosis Date   Dysmenorrhea    Endocervical polyp    Fibroids    GERD (gastroesophageal reflux disease)    diet controlled   H/O varicella    History of chicken pox    History of measles    History of measles, mumps, or rubella    History of  mumps    Menorrhagia    Nocturia    Unstable bladder    Urinary frequency    Yeast infection      Family History  Problem Relation Age of Onset   Diverticulitis Mother    Diabetes Father    Diabetes Paternal Grandfather      Current Outpatient Medications:    Ascorbic Acid (VITAMIN C PO), Take by mouth., Disp: , Rfl:    Biotin w/ Vitamins C & E (HAIR/SKIN/NAILS PO), Take by mouth., Disp: , Rfl:    Calcium Carbonate (CALCIUM 600 PO), Take by mouth. 1 daily, Disp: , Rfl:    Cholecalciferol (VITAMIN D3 PO), 5,000 Units daily. , Disp: , Rfl:    Cyanocobalamin (B-12 PO), Take by mouth., Disp: , Rfl:    EPINEPHrine (EPIPEN 2-PAK) 0.3 mg/0.3 mL IJ SOAJ injection, EpiPen 2-Pak 0.3 mg/0.3 mL injection, auto-injector, Disp: 1 each, Rfl: 2   fluticasone (FLONASE) 50 MCG/ACT nasal spray, 2 sprays via each nostril daily, Disp: 16 g, Rfl: 1   ibuprofen (ADVIL,MOTRIN) 600 MG tablet, 1 po pc q 6 hours x 5 days then prn-pain, Disp: 30 tablet, Rfl: 1   levocetirizine (XYZAL) 5 MG tablet, Take 1 tablet (5 mg total) by mouth every evening., Disp: 90 tablet, Rfl: 1   MILK THISTLE PO, Take by mouth., Disp: ,  Rfl:    Multiple Vitamins-Minerals (MULTIVITAMIN ADULTS PO), multivitamin, Disp: , Rfl:    Probiotic Product (RESTORA) CAPS, TAKE ONE CAPSULE BY MOUTH EVERY DAY, Disp: 30 capsule, Rfl: 5   TURMERIC PO, Take by mouth., Disp: , Rfl:    Allergies  Allergen Reactions   Almond (Diagnostic) Anaphylaxis   Cat Hair Extract    Milk-Related Compounds      Review of Systems  Constitutional: Negative.  Negative for fever.  Respiratory: Negative.    Cardiovascular: Negative.   Gastrointestinal: Negative.   Musculoskeletal:  Positive for arthralgias.       Left pinky toe Right wrist  Psychiatric/Behavioral: Negative.    All other systems reviewed and are negative.   Today's Vitals   05/31/21 1421  BP: 112/76  Pulse: 87  Temp: 98.9 F (37.2 C)  TempSrc: Oral  Weight: 170 lb (77.1 kg)  Height:  5' 5.8" (1.671 m)  PainSc: 3   PainLoc: Toe   Body mass index is 27.61 kg/m.  Wt Readings from Last 3 Encounters:  06/08/21 170 lb 9.6 oz (77.4 kg)  05/31/21 170 lb (77.1 kg)  05/02/21 166 lb 6.4 oz (75.5 kg)    BP Readings from Last 3 Encounters:  06/08/21 110/82  05/31/21 112/76  05/02/21 126/84    Objective:  Physical Exam Vitals and nursing note reviewed.  Constitutional:      Appearance: Normal appearance.  HENT:     Head: Normocephalic and atraumatic.  Cardiovascular:     Rate and Rhythm: Normal rate and regular rhythm.     Heart sounds: Normal heart sounds.  Pulmonary:     Effort: Pulmonary effort is normal.     Breath sounds: Normal breath sounds.  Musculoskeletal:     Cervical back: Normal range of motion.     Comments: Left 5th toe swelling, no erythema, tender to palpation  Right wrist - there is some pain with movement. No overlying erythema  Skin:    General: Skin is warm.  Neurological:     General: No focal deficit present.     Mental Status: She is alert.  Psychiatric:        Mood and Affect: Mood normal.        Behavior: Behavior normal.        Assessment And Plan:     1. Right wrist tendinitis Comments: She is advised to get OTC brace to wear daily. Also advised to apply Voltaren gel to affected area tid prn. She agrees to see Ortho should her sx persist/worsen.   2. Toe pain, left Comments: Left 4th and 5th toes were taped together. Again, advised to apply Voltaren gel to affected area bid prn.   3. Adjustment insomnia Comments: Importance of bedtime hygiene was discussed with the patient. Encouraged to develop bedtime routine. Advised to also try melatonin nightly.   4. Immunization due Comments: I will send rx Shingrix to her local pharmacy.    Patient was given opportunity to ask questions. Patient verbalized understanding of the plan and was able to repeat key elements of the plan. All questions were answered to their satisfaction.    I, [name], MD, have reviewed all documentation for this visit. The documentation on 06/09/21 for the exam, diagnosis, procedures, and orders are all accurate and complete.   IF YOU HAVE BEEN REFERRED TO A SPECIALIST, IT MAY TAKE 1-2 WEEKS TO SCHEDULE/PROCESS THE REFERRAL. IF YOU HAVE NOT HEARD FROM US/SPECIALIST IN TWO WEEKS, PLEASE GIVE [place] A  CALL AT [phone].   THE PATIENT IS ENCOURAGED TO PRACTICE SOCIAL DISTANCING DUE TO THE COVID-19 PANDEMIC.

## 2021-06-08 ENCOUNTER — Other Ambulatory Visit: Payer: Self-pay

## 2021-06-08 ENCOUNTER — Ambulatory Visit: Payer: BC Managed Care – PPO | Admitting: Internal Medicine

## 2021-06-08 ENCOUNTER — Encounter: Payer: Self-pay | Admitting: Internal Medicine

## 2021-06-08 VITALS — BP 110/82 | HR 74 | Temp 98.1°F | Ht 65.8 in | Wt 170.6 lb

## 2021-06-08 DIAGNOSIS — F5102 Adjustment insomnia: Secondary | ICD-10-CM

## 2021-06-08 DIAGNOSIS — M778 Other enthesopathies, not elsewhere classified: Secondary | ICD-10-CM

## 2021-06-08 DIAGNOSIS — F4322 Adjustment disorder with anxiety: Secondary | ICD-10-CM | POA: Diagnosis not present

## 2021-06-08 NOTE — Patient Instructions (Signed)

## 2021-06-08 NOTE — Progress Notes (Signed)
I,Katawbba Wiggins,acting as a Education administrator for [name], MD.,have documented all relevant documentation on the behalf of [name], MD,as directed by  [name], MD while in the presence of [name], MD.  This visit occurred during the SARS-CoV-2 public health emergency.  Safety protocols were in place, including screening questions prior to the visit, additional usage of staff PPE, and extensive cleaning of exam room while observing appropriate contact time as indicated for disinfecting solutions.  Subjective:     Patient ID: Sheryl [name] , female    DOB: [birth year] , 55 y.o.   MRN: [id number]   Chief Complaint  Patient presents with   Anxiety    HPI  The patient is here today for a follow-up on anxiety.  The patient states she is feeling better. She is now in therapy with [facility]. She reports having good rapport with her therapist.   Anxiety Presents for follow-up visit.      Past Medical History:  Diagnosis Date   Dysmenorrhea    Endocervical polyp    Fibroids    GERD (gastroesophageal reflux disease)    diet controlled   H/O varicella    History of chicken pox    History of measles    History of measles, mumps, or rubella    History of mumps    Menorrhagia    Nocturia    Unstable bladder    Urinary frequency    Yeast infection      Family History  Problem Relation Age of Onset   Diverticulitis Mother    Diabetes Father    Diabetes Paternal Grandfather      Current Outpatient Medications:    Ascorbic Acid (VITAMIN C PO), Take by mouth., Disp: , Rfl:    Biotin w/ Vitamins C & E (HAIR/SKIN/NAILS PO), Take by mouth., Disp: , Rfl:    Calcium Carbonate (CALCIUM 600 PO), Take by mouth. 1 daily, Disp: , Rfl:    Cholecalciferol (VITAMIN D3 PO), 5,000 Units daily. , Disp: , Rfl:    clonazePAM (KLONOPIN) 0.5 MG tablet, Take 1 tablet (0.5 mg total) by mouth 2 (two) times daily. Prn anxiety, Disp: 30 tablet, Rfl: 0   Cyanocobalamin (B-12 PO), Take  by mouth., Disp: , Rfl:    EPINEPHrine (EPIPEN 2-PAK) 0.3 mg/0.3 mL IJ SOAJ injection, EpiPen 2-Pak 0.3 mg/0.3 mL injection, auto-injector, Disp: 1 each, Rfl: 2   fluticasone (FLONASE) 50 MCG/ACT nasal spray, 2 sprays via each nostril daily, Disp: 16 g, Rfl: 1   ibuprofen (ADVIL,MOTRIN) 600 MG tablet, 1 po pc q 6 hours x 5 days then prn-pain, Disp: 30 tablet, Rfl: 1   levocetirizine (XYZAL) 5 MG tablet, Take 1 tablet (5 mg total) by mouth every evening., Disp: 90 tablet, Rfl: 1   MILK THISTLE PO, Take by mouth., Disp: , Rfl:    Multiple Vitamins-Minerals (MULTIVITAMIN ADULTS PO), multivitamin, Disp: , Rfl:    Probiotic Product (RESTORA) CAPS, TAKE ONE CAPSULE BY MOUTH EVERY DAY, Disp: 30 capsule, Rfl: 5   TURMERIC PO, Take by mouth., Disp: , Rfl:    Allergies  Allergen Reactions   Almond (Diagnostic) Anaphylaxis   Cat Hair Extract    Milk-Related Compounds      Review of Systems  Constitutional: Negative.   Respiratory: Negative.    Cardiovascular: Negative.   Gastrointestinal: Negative.   Psychiatric/Behavioral: Negative.    All other systems reviewed and are negative.   Today's Vitals   06/08/21 1046  BP: 110/82  Pulse: 74  Temp: 98.1 F (36.7 C)  TempSrc: Oral  Weight: 170 lb 9.6 oz (77.4 kg)  Height: 5' 5.8" (1.671 m)   Body mass index is 27.7 kg/m.  Wt Readings from Last 3 Encounters:  06/08/21 170 lb 9.6 oz (77.4 kg)  05/31/21 170 lb (77.1 kg)  05/02/21 166 lb 6.4 oz (75.5 kg)    BP Readings from Last 3 Encounters:  06/08/21 110/82  05/31/21 112/76  05/02/21 126/84    Objective:  Physical Exam Vitals and nursing note reviewed.  Constitutional:      Appearance: Normal appearance.  HENT:     Head: Normocephalic and atraumatic.     Nose:     Comments: MASKED    Mouth/Throat:     Comments: MASKED  Cardiovascular:     Rate and Rhythm: Normal rate and regular rhythm.     Heart sounds: Normal heart sounds.  Pulmonary:     Effort: Pulmonary effort is  normal.     Breath sounds: Normal breath sounds.  Musculoskeletal:     Cervical back: Normal range of motion.  Skin:    General: Skin is warm.  Neurological:     General: No focal deficit present.     Mental Status: She is alert.  Psychiatric:        Mood and Affect: Mood normal.        Behavior: Behavior normal.        Assessment And Plan:     1. Adjustment disorder with anxiety Comments: She is encouraged to c/w therapy. Also advised to c/w magnesium supplementation, t/c L-theanine if sx persist/worsen. Wants to avoid rx meds if possible.   2. Adjustment insomnia Comments: Encouraged to have a bedtime routine. Please see above.   3. Right wrist tendinitis Comments: She was given information for [facility] for f/u.     Patient was given opportunity to ask questions. Patient verbalized understanding of the plan and was able to repeat key elements of the plan. All questions were answered to their satisfaction.   I, [name], MD, have reviewed all documentation for this visit. The documentation on 06/08/21 for the exam, diagnosis, procedures, and orders are all accurate and complete.   IF YOU HAVE BEEN REFERRED TO A SPECIALIST, IT MAY TAKE 1-2 WEEKS TO SCHEDULE/PROCESS THE REFERRAL. IF YOU HAVE NOT HEARD FROM US/SPECIALIST IN TWO WEEKS, PLEASE GIVE [place] A CALL AT [phone].   THE PATIENT IS ENCOURAGED TO PRACTICE SOCIAL DISTANCING DUE TO THE COVID-19 PANDEMIC.

## 2021-06-12 ENCOUNTER — Encounter: Payer: Self-pay | Admitting: Internal Medicine

## 2021-06-13 ENCOUNTER — Encounter: Payer: Self-pay | Admitting: Internal Medicine

## 2021-06-14 ENCOUNTER — Other Ambulatory Visit: Payer: Self-pay | Admitting: Internal Medicine

## 2021-06-14 LAB — HM PAP SMEAR: HM Pap smear: POSITIVE

## 2021-06-14 MED ORDER — CLONAZEPAM 0.5 MG PO TABS: 0.5000 mg | ORAL_TABLET | Freq: Two times a day (BID) | ORAL | 0 refills | Status: DC

## 2021-06-15 ENCOUNTER — Encounter: Payer: Self-pay | Admitting: Internal Medicine

## 2021-06-15 ENCOUNTER — Telehealth: Payer: Self-pay

## 2021-06-15 NOTE — Telephone Encounter (Signed)
Left the pt a message her leave and absence form has been faxed with the return to work letter and that the letter will be refaxed Tuesday with a signature.

## 2021-08-03 ENCOUNTER — Ambulatory Visit: Payer: BC Managed Care – PPO | Admitting: Internal Medicine

## 2021-08-03 ENCOUNTER — Encounter: Payer: Self-pay | Admitting: Internal Medicine

## 2021-08-03 ENCOUNTER — Other Ambulatory Visit: Payer: Self-pay

## 2021-08-03 VITALS — BP 128/70 | HR 95 | Temp 98.7°F | Ht 64.8 in | Wt 175.4 lb

## 2021-08-03 DIAGNOSIS — R059 Cough, unspecified: Secondary | ICD-10-CM

## 2021-08-03 DIAGNOSIS — M25552 Pain in left hip: Secondary | ICD-10-CM | POA: Diagnosis not present

## 2021-08-03 DIAGNOSIS — E663 Overweight: Secondary | ICD-10-CM

## 2021-08-03 DIAGNOSIS — R400 Somnolence: Secondary | ICD-10-CM

## 2021-08-03 DIAGNOSIS — Z6829 Body mass index (BMI) 29.0-29.9, adult: Secondary | ICD-10-CM

## 2021-08-03 MED ORDER — MONTELUKAST SODIUM 10 MG PO TABS
10.0000 mg | ORAL_TABLET | Freq: Every day | ORAL | 2 refills | Status: DC
Start: 2021-08-03 — End: 2021-11-13

## 2021-08-03 MED ORDER — BENZONATATE 100 MG PO CAPS
100.0000 mg | ORAL_CAPSULE | Freq: Three times a day (TID) | ORAL | 1 refills | Status: DC | PRN
Start: 2021-08-03 — End: 2021-12-27

## 2021-08-03 NOTE — Patient Instructions (Signed)

## 2021-08-03 NOTE — Progress Notes (Signed)
I,Tianna Badgett,acting as a Education administrator for [name], MD.,have documented all relevant documentation on the behalf of [name], MD,as directed by  [name], MD while in the presence of [name], MD.  This visit occurred during the SARS-CoV-2 public health emergency.  Safety protocols were in place, including screening questions prior to the visit, additional usage of staff PPE, and extensive cleaning of exam room while observing appropriate contact time as indicated for disinfecting solutions.  Subjective:     Patient ID: Sheryl [name] , female    DOB: 11-18-1966 , 55 y.o.   MRN: [id number]   Chief Complaint  Patient presents with   Cough    HPI  Patient is here for cough that has persisted for 2 weeks. In the mornings, she does admit to coughing up dark yellow phlegm.  She has been using both Xyzal and fluticasone NS. She states she has been on other meds in the past. She thinks it was montelukast. She would like a refill of this.  Drinking fluids does not cause her to cough. She denies fever/chills/body aches. She feels SOB during her coughing spells.   She is also experiencing left hip pain that started last week. She can't recall what she was doing when the pain started. The pain starts in her left groin as a sharp pain. She has also noticed sharp pains on the lateral side of her left leg.  She is not currently exercising on a regular basis.   Lastly, she would also like to discuss getting a sleep study due to waking up tired everyday.   Cough This is a recurrent problem. The current episode started 1 to 4 weeks ago. Associated symptoms include myalgias and shortness of breath.    Past Medical History:  Diagnosis Date   Dysmenorrhea    Endocervical polyp    Fibroids    GERD (gastroesophageal reflux disease)    diet controlled   H/O varicella    History of chicken pox    History of measles    History of measles, mumps, or rubella    History of mumps     Menorrhagia    Nocturia    Unstable bladder    Urinary frequency    Yeast infection      Family History  Problem Relation Age of Onset   Diverticulitis Mother    Diabetes Father    Diabetes Paternal Grandfather      Current Outpatient Medications:    Ascorbic Acid (VITAMIN C PO), Take by mouth., Disp: , Rfl:    benzonatate (TESSALON PERLES) 100 MG capsule, Take 1 capsule (100 mg total) by mouth 3 (three) times daily as needed for cough., Disp: 30 capsule, Rfl: 1   Biotin w/ Vitamins C & E (HAIR/SKIN/NAILS PO), Take by mouth., Disp: , Rfl:    Calcium Carbonate (CALCIUM 600 PO), Take by mouth. 1 daily, Disp: , Rfl:    Cholecalciferol (VITAMIN D3 PO), 5,000 Units daily. , Disp: , Rfl:    clonazePAM (KLONOPIN) 0.5 MG tablet, Take 1 tablet (0.5 mg total) by mouth 2 (two) times daily. Prn anxiety, Disp: 30 tablet, Rfl: 0   Cyanocobalamin (B-12 PO), Take by mouth., Disp: , Rfl:    EPINEPHrine (EPIPEN 2-PAK) 0.3 mg/0.3 mL IJ SOAJ injection, EpiPen 2-Pak 0.3 mg/0.3 mL injection, auto-injector, Disp: 1 each, Rfl: 2   fluticasone (FLONASE) 50 MCG/ACT nasal spray, 2 sprays via each nostril daily, Disp: 16 g, Rfl: 1   ibuprofen (  ADVIL,MOTRIN) 600 MG tablet, 1 po pc q 6 hours x 5 days then prn-pain, Disp: 30 tablet, Rfl: 1   levocetirizine (XYZAL) 5 MG tablet, Take 1 tablet (5 mg total) by mouth every evening., Disp: 90 tablet, Rfl: 1   MILK THISTLE PO, Take by mouth., Disp: , Rfl:    montelukast (SINGULAIR) 10 MG tablet, Take 1 tablet (10 mg total) by mouth daily., Disp: 30 tablet, Rfl: 2   Multiple Vitamins-Minerals (MULTIVITAMIN ADULTS PO), multivitamin, Disp: , Rfl:    Probiotic Product (RESTORA) CAPS, TAKE ONE CAPSULE BY MOUTH EVERY DAY, Disp: 30 capsule, Rfl: 5   TURMERIC PO, Take by mouth., Disp: , Rfl:    Allergies  Allergen Reactions   Almond (Diagnostic) Anaphylaxis   Cat Hair Extract    Milk-Related Compounds      Review of Systems  Constitutional: Negative.   Respiratory:   Positive for cough and shortness of breath.   Cardiovascular: Negative.   Gastrointestinal: Negative.   Musculoskeletal:  Positive for arthralgias and myalgias.  Neurological: Negative.     Today's Vitals   08/03/21 1159  BP: 128/70  Pulse: 95  Temp: 98.7 F (37.1 C)  TempSrc: Oral  SpO2: 98%  Weight: 175 lb 6.4 oz (79.6 kg)  Height: 5' 4.8" (1.646 m)   Body mass index is 29.37 kg/m.  Wt Readings from Last 3 Encounters:  08/03/21 175 lb 6.4 oz (79.6 kg)  06/08/21 170 lb 9.6 oz (77.4 kg)  05/31/21 170 lb (77.1 kg)    Objective:  Physical Exam Vitals and nursing note reviewed.  Constitutional:      Appearance: Normal appearance.  HENT:     Head: Normocephalic and atraumatic.     Right Ear: Tympanic membrane, ear canal and external ear normal. There is no impacted cerumen.     Left Ear: Tympanic membrane, ear canal and external ear normal. There is no impacted cerumen.     Nose:     Comments: Masked     Mouth/Throat:     Comments: Masked  Cardiovascular:     Rate and Rhythm: Normal rate and regular rhythm.     Heart sounds: Normal heart sounds.  Pulmonary:     Effort: Pulmonary effort is normal.     Breath sounds: Normal breath sounds.  Musculoskeletal:        General: Tenderness present.     Cervical back: Normal range of motion.  Skin:    General: Skin is warm.  Neurological:     General: No focal deficit present.     Mental Status: She is alert.  Psychiatric:        Mood and Affect: Mood normal.        Behavior: Behavior normal.        Assessment And Plan:     1. Cough Comments: Likely due to PND. Encouraged to avoid dairy for next two weeks. I will add montelukast '10mg'$  to her regimen. Will also send tessalon perles prn.   2. Left hip pain Comments: She was given stretches to perform daily, also encouraged to consider ART therapy. She plans to f/u with Dr. Raynelle Chary.   3. Daytime somnolence Comments: Also w/ nocturia, snoring, early am headaches, and  non-restorative sleep. I will refer her to Neuro for a sleep study.  - Ambulatory referral to Neurology  4. Overweight with body mass index (BMI) of 29 to 29.9 in adult Comments: Encouraged to aim for at least 150 minutes of exercise per week.  Patient was given opportunity to ask questions. Patient verbalized understanding of the plan and was able to repeat key elements of the plan. All questions were answered to their satisfaction.   I, [name], MD, have reviewed all documentation for this visit. The documentation on 08/07/21 for the exam, diagnosis, procedures, and orders are all accurate and complete.  IF YOU HAVE BEEN REFERRED TO A SPECIALIST, IT MAY TAKE 1-2 WEEKS TO SCHEDULE/PROCESS THE REFERRAL. IF YOU HAVE NOT HEARD FROM US/SPECIALIST IN TWO WEEKS, PLEASE GIVE [place] A CALL AT [phone].   THE PATIENT IS ENCOURAGED TO PRACTICE SOCIAL DISTANCING DUE TO THE COVID-19 PANDEMIC.

## 2021-09-18 LAB — HM MAMMOGRAPHY

## 2021-10-26 ENCOUNTER — Ambulatory Visit: Payer: BC Managed Care – PPO | Admitting: Neurology

## 2021-10-26 ENCOUNTER — Encounter: Payer: Self-pay | Admitting: Neurology

## 2021-10-26 VITALS — BP 133/87 | HR 79 | Ht 66.0 in | Wt 179.4 lb

## 2021-10-26 DIAGNOSIS — R519 Headache, unspecified: Secondary | ICD-10-CM | POA: Diagnosis not present

## 2021-10-26 DIAGNOSIS — G478 Other sleep disorders: Secondary | ICD-10-CM

## 2021-10-26 DIAGNOSIS — R0683 Snoring: Secondary | ICD-10-CM

## 2021-10-26 DIAGNOSIS — E663 Overweight: Secondary | ICD-10-CM | POA: Diagnosis not present

## 2021-10-26 DIAGNOSIS — G4719 Other hypersomnia: Secondary | ICD-10-CM

## 2021-10-26 DIAGNOSIS — R635 Abnormal weight gain: Secondary | ICD-10-CM

## 2021-10-26 NOTE — Progress Notes (Signed)
Subjective:    Sheryl [name] ID: Sheryl Sheryl [name] is a 55 y.o. female.  HPI    Sheryl Age, MD, PhD [facility] [address] [address] [place], [place] [address]  Dear Dr. Baird Cancer,   I saw your Sheryl [name], Sheryl Sheryl [name], upon your kind request, in my sleep clinic today for initial consultation of her sleep disorder, in particular, concern for underlying obstructive sleep apnea.  Sheryl Sheryl [name] is unaccompanied today.  As you know, Sheryl Sheryl [name] is a 55 year old right-handed woman with an underlying medical history of reflux disease, urinary frequency, uterine fibroids, hip pain, and overweight state, who reports snoring, nonrestorative sleep, morning headaches, and excessive daytime somnolence.  I reviewed your office note from 08/03/2021.  Her Epworth sleepiness score is 7 out of 24, fatigue severity score is 36 out of 63.  Sheryl Sheryl [name] has had worsening daytime tiredness.  Sheryl Sheryl [name] has a family history of snoring.  Sheryl Sheryl [name] has gained weight in Sheryl past couple of years in Sheryl realm of 20+ pounds.  Sheryl Sheryl [name] reports that Sheryl Sheryl [name] snored even before more weight gain.  Sheryl Sheryl [name] has nocturia about twice per average night.  Sheryl Sheryl [name] occasionally wakes up with a dull, achy headache, not debilitating but enough to take a Tylenol as needed.  Sheryl Sheryl [name] does not have a TV in her bedroom, no pets in Sheryl house.  Sheryl Sheryl [name] lives alone, Sheryl Sheryl [name] has a 22 year old son and 1 grandchild.  Sheryl Sheryl [name] works for a [employer].  Sheryl Sheryl [name] works from home.  Bedtime is generally around 10 PM, rise time around 7 AM.  Sheryl Sheryl [name] drinks caffeine in Sheryl form of coffee, 1 cup/day on average, 1 soda per day on average.  Sheryl Sheryl [name] drinks alcohol occasionally, Sheryl Sheryl [name] is a non-smoker.  Her Past Medical History Is Significant For: Past Medical History:  Diagnosis Date   Dysmenorrhea    Endocervical polyp    Fibroids    GERD (gastroesophageal reflux disease)    diet controlled   H/O varicella    History of chicken pox    History of measles    History of measles, mumps, or  rubella    History of mumps    Menorrhagia    Nocturia    Unstable bladder    Urinary frequency    Yeast infection     Her Past Surgical History Is Significant For: Past Surgical History:  Procedure Laterality Date   MYOMECTOMY N/A 10/28/2013   Procedure: MYOMECTOMY;  Surgeon: Ena Dawley, MD;  Location: [facility];  Service: Gynecology;  Laterality: N/A;    Her Family History Is Significant For: Family History  Problem Relation [name] of Onset   Diverticulitis Mother    Diabetes Father    Diabetes Paternal Grandfather    Sleep apnea Neg Hx     Her Social History Is Significant For: Social History   Socioeconomic History   Marital status: Single    Spouse name: Not on file   Number of children: Not on file   Years of education: Not on file   Highest education level: Not on file  Occupational History   Not on file  Tobacco Use   Smoking status: Never   Smokeless tobacco: Never  Vaping Use   Vaping Use: Never used  Substance and Sexual Activity   Alcohol use: Yes    Comment: 3 glasses of wine montly   Drug use: No   Sexual activity: Not on file  Other Topics Concern   Not on file  Social History Narrative   Not on  file   Social Determinants of Health   Financial Resource Strain: Not on file  Food Insecurity: Not on file  Transportation Needs: Not on file  Physical Activity: Not on file  Stress: Not on file  Social Connections: Not on file    Her Allergies Are:  Allergies  Allergen Reactions   Almond (Diagnostic) Anaphylaxis   Cat Hair Extract    Milk-Related Compounds   :   Her Current Medications Are:  Outpatient Encounter Medications as of 10/26/2021  Medication Sig   Ascorbic Acid (VITAMIN C PO) Take by mouth.   benzonatate (TESSALON PERLES) 100 MG capsule Take 1 capsule (100 mg total) by mouth 3 (three) times daily as needed for cough.   Biotin w/ Vitamins C & E (HAIR/SKIN/NAILS PO) Take by mouth.   Calcium Carbonate (CALCIUM 600 PO) Take by  mouth. 1 daily   Cholecalciferol (VITAMIN D3 PO) 5,000 Units daily.    clonazePAM (KLONOPIN) 0.5 MG tablet Take 1 tablet (0.5 mg total) by mouth 2 (two) times daily. Prn anxiety   Cyanocobalamin (B-12 PO) Take by mouth.   EPINEPHrine (EPIPEN 2-PAK) 0.3 mg/0.3 mL IJ SOAJ injection EpiPen 2-Pak 0.3 mg/0.3 mL injection, auto-injector   fluticasone (FLONASE) 50 MCG/ACT nasal spray 2 sprays via each nostril daily   ibuprofen (ADVIL,MOTRIN) 600 MG tablet 1 po pc q 6 hours x 5 days then prn-pain   levocetirizine (XYZAL) 5 MG tablet Take 1 tablet (5 mg total) by mouth every evening.   MILK THISTLE PO Take by mouth.   montelukast (SINGULAIR) 10 MG tablet Take 1 tablet (10 mg total) by mouth daily.   Multiple Vitamins-Minerals (MULTIVITAMIN ADULTS PO) multivitamin   Probiotic Product (RESTORA) CAPS TAKE ONE CAPSULE BY MOUTH EVERY DAY   TURMERIC PO Take by mouth.   No facility-administered encounter medications on file as of 10/26/2021.  :   Review of Systems:  Out of a complete 14 point review of systems, all are reviewed and negative with Sheryl exception of these symptoms as listed below:  Review of Systems  Neurological:        Pt is here for sleep study consult. Pt states Sheryl Sheryl [name] snores at night , has headaches in am and fatigue throughout Sheryl day. Pt denies Hypertension , sleep study and CPAP .   FSS:36 ESS:7   Objective:  Neurological Exam  Physical Exam Physical Examination:   Vitals:   10/26/21 1512  BP: 133/87  Pulse: 79    General Examination: Sheryl Sheryl [name] is a very pleasant 55 y.o. female in no acute distress. Sheryl Sheryl [name] appears well-developed and well-nourished and well groomed.   HEENT: Normocephalic, atraumatic, pupils are equal, round and reactive to light, extraocular tracking is good without limitation to gaze excursion or nystagmus noted. Hearing is grossly intact. Face is symmetric with normal facial animation. Speech is clear with no dysarthria noted. There is no hypophonia.  There is no lip, neck/head, jaw or voice tremor. Neck is supple with full range of passive and active motion. There are no carotid bruits on auscultation. Oropharynx exam reveals: mild mouth dryness, good dental hygiene and moderate airway crowding, due to redundant soft palate and Mallampati class IV, tip of uvula and tonsils not fully visualized.  Tongue protrudes centrally.  Wider tongue base noted.  Neck circumference of 14 3/8 inches.  Sheryl Sheryl [name] has mild overbite.  Chest: Clear to auscultation without wheezing, rhonchi or crackles noted.  Heart: S1+S2+0, regular and normal without murmurs, rubs or gallops noted.   Abdomen:  Soft, non-tender and non-distended.  Extremities: There is no obvious edema in Sheryl distal lower extremities bilaterally.   Skin: Warm and dry without trophic changes noted.   Musculoskeletal: exam reveals no obvious joint deformities, tenderness or joint swelling or erythema.   Neurologically:  Mental status: Sheryl Sheryl [name] is awake, alert and oriented in all 4 spheres. Her immediate and remote memory, attention, language skills and fund of knowledge are appropriate. There is no evidence of aphasia, agnosia, apraxia or anomia. Speech is clear with normal prosody and enunciation. Thought process is linear. Mood is normal and affect is normal.  Cranial nerves II - XII are as described above under HEENT exam.  Motor exam: Normal bulk, strength and tone is noted. There is no tremor, fine motor skills and coordination: grossly intact.  Cerebellar testing: No dysmetria or intention tremor. There is no truncal or gait ataxia.  Sensory exam: intact to light touch in Sheryl upper and lower extremities.  Gait, station and balance: Sheryl Sheryl [name] stands easily. No veering to one side is noted. No leaning to one side is noted. Posture is [name]-appropriate and stance is narrow based. Gait shows normal stride length and normal pace. No problems turning are noted.   Assessment and Plan:  In summary, Sheryl Sheryl [name] is a very pleasant 55 y.o.-year old female with an underlying medical history of reflux disease, urinary frequency, uterine fibroids, hip pain, and overweight state, whose history and physical exam are concerning for obstructive sleep apnea (OSA). I had a long chat with Sheryl Sheryl [name] about my findings and Sheryl diagnosis of OSA, its prognosis and treatment options. We talked about medical treatments, surgical interventions and non-pharmacological approaches. I explained in particular Sheryl risks and ramifications of untreated moderate to severe OSA, especially with respect to developing cardiovascular disease down Sheryl Road, including congestive heart failure, difficult to treat hypertension, cardiac arrhythmias, or stroke. Even type 2 diabetes has, in part, been linked to untreated OSA. Symptoms of untreated OSA include daytime sleepiness, memory problems, mood irritability and mood disorder such as depression and anxiety, lack of energy, as well as recurrent headaches, especially morning headaches. We talked about trying to maintain a healthy lifestyle in general, as well as Sheryl importance of weight control. We also talked about Sheryl importance of good sleep hygiene. I recommended Sheryl following at this time: sleep study. I explained Sheryl difference between a lab-attended sleep study vs. a home sleep test.    I explained Sheryl sleep test procedure to Sheryl Sheryl [name] and also outlined possible surgical and non-surgical treatment options of OSA, including Sheryl use of a custom-made dental device (which would require a referral to a specialist dentist or oral surgeon), upper airway surgical options, such as traditional UPPP or a novel less invasive surgical option in Sheryl form of Inspire hypoglossal nerve stimulation (which would involve a referral to an ENT surgeon). I also explained Sheryl CPAP treatment option to Sheryl Sheryl [name], who indicated that Sheryl Sheryl [name] would be willing to try CPAP or autoPAP, if Sheryl need arises.  We will pick  up our discussion after testing, we will keep her posted as to her test results by phone call and plan to follow-up in this office accordingly as well.  I answered all her questions today and Sheryl Sheryl [name] was in agreement.   Thank you very much for allowing me to participate in Sheryl care of this nice Sheryl [name]. If I can be of any further assistance to you please do not hesitate to call me at [phone].  Sincerely,   Sheryl Age, MD, PhD

## 2021-10-26 NOTE — Patient Instructions (Signed)

## 2021-11-10 ENCOUNTER — Other Ambulatory Visit: Payer: Self-pay | Admitting: Internal Medicine

## 2021-11-29 ENCOUNTER — Ambulatory Visit (INDEPENDENT_AMBULATORY_CARE_PROVIDER_SITE_OTHER): Payer: BC Managed Care – PPO | Admitting: Neurology

## 2021-11-29 DIAGNOSIS — G4733 Obstructive sleep apnea (adult) (pediatric): Secondary | ICD-10-CM | POA: Diagnosis not present

## 2021-11-29 DIAGNOSIS — E663 Overweight: Secondary | ICD-10-CM

## 2021-11-29 DIAGNOSIS — R635 Abnormal weight gain: Secondary | ICD-10-CM

## 2021-11-29 DIAGNOSIS — R519 Headache, unspecified: Secondary | ICD-10-CM

## 2021-11-29 DIAGNOSIS — G4719 Other hypersomnia: Secondary | ICD-10-CM

## 2021-11-29 DIAGNOSIS — R0683 Snoring: Secondary | ICD-10-CM

## 2021-11-29 DIAGNOSIS — G478 Other sleep disorders: Secondary | ICD-10-CM

## 2021-12-01 NOTE — Progress Notes (Signed)
°  ° °  [facility]  HOME SLEEP TEST (Watch PAT) REPORT  STUDY DATE: 11/29/2021  DOB: [birth year]  MRN: [id number]  ORDERING CLINICIAN: Star Age, MD, PhD   REFERRING CLINICIAN: Glendale Chard, MD   CLINICAL INFORMATION/HISTORY: 55 year old right-handed woman with an underlying medical history of reflux disease, urinary frequency, uterine fibroids, hip pain, and overweight state, who reports snoring, nonrestorative sleep, morning headaches, and excessive daytime somnolence.   Epworth sleepiness score: 7/24.  BMI: 28.9 kg/m  FINDINGS:   Sleep Summary:   Total Recording Time (hours, min): 8 hours, 17 minutes  Total Sleep Time (hours, min):  6 hours, 24 minutes   Percent REM (%):    18.5%   Respiratory Indices:   Calculated pAHI (per hour):  19.4/hour         REM pAHI:    27.3/hour       NREM pAHI: 17.6/hour  Oxygen Saturation Statistics:    Oxygen Saturation (%) Mean: 91%   Minimum oxygen saturation (%):                 85%   O2 Saturation Range (%): 85-97%    O2 Saturation (minutes) <=88%: 2.7 min  Pulse Rate Statistics:   Pulse Mean (bpm):    74/min    Pulse Range (62-108/min)   IMPRESSION: OSA (obstructive sleep apnea)   RECOMMENDATION:  This home sleep test demonstrates moderate obstructive sleep apnea with a total AHI of 19.4/hour and O2 nadir of 85%.  Snore channel, sensor was not reliable during this test. Treatment with positive airway pressure is recommended. The patient will be advised to proceed with an autoPAP titration/trial at home for now. A full night titration study may be considered to optimize treatment settings, if needed down the road. Please note that untreated obstructive sleep apnea may carry additional perioperative morbidity. Patients with significant obstructive sleep apnea should receive perioperative PAP therapy and the surgeons and particularly the anesthesiologist should be informed of the diagnosis and the  severity of the sleep disordered breathing.  Alternative treatment options may include dental treatment with the help of a dentist to consider an oral appliance or surgical treatment option such as inspire through ENT.  Weight loss may help in reducing the sleep apnea severity and snoring. The patient should be cautioned not to drive, work at heights, or operate dangerous or heavy equipment when tired or sleepy. Review and reiteration of good sleep hygiene measures should be pursued with any patient. Other causes of the patient's symptoms, including circadian rhythm disturbances, an underlying mood disorder, medication effect and/or an underlying medical problem cannot be ruled out based on this test. Clinical correlation is recommended. The patient and her referring provider will be notified of the test results. The patient will be seen in follow up in sleep clinic at [facility].  I certify that I have reviewed the raw data recording prior to the issuance of this report in accordance with the standards of the American Academy of Sleep Medicine (AASM).   INTERPRETING PHYSICIAN:   Star Age, MD, PhD  Board Certified in Neurology and Sleep Medicine  [facility] [address], [place], [place] [address] [phone]

## 2021-12-01 NOTE — Addendum Note (Signed)
Addended by: Star Age on: 12/01/2021 01:05 PM   Modules accepted: Orders

## 2021-12-01 NOTE — Procedures (Signed)
°  ° °  [facility]  HOME SLEEP TEST (Watch PAT) REPORT  STUDY DATE: 11/29/2021  DOB: [birth year]  MRN: [id number]  ORDERING CLINICIAN: Star Age, MD, PhD   REFERRING CLINICIAN: Glendale Chard, MD   CLINICAL INFORMATION/HISTORY: 55 year old right-handed woman with an underlying medical history of reflux disease, urinary frequency, uterine fibroids, hip pain, and overweight state, who reports snoring, nonrestorative sleep, morning headaches, and excessive daytime somnolence.   Epworth sleepiness score: 7/24.  BMI: 28.9 kg/m  FINDINGS:   Sleep Summary:   Total Recording Time (hours, min): 8 hours, 17 minutes  Total Sleep Time (hours, min):  6 hours, 24 minutes   Percent REM (%):    18.5%   Respiratory Indices:   Calculated pAHI (per hour):  19.4/hour         REM pAHI:    27.3/hour       NREM pAHI: 17.6/hour  Oxygen Saturation Statistics:    Oxygen Saturation (%) Mean: 91%   Minimum oxygen saturation (%):                 85%   O2 Saturation Range (%): 85-97%    O2 Saturation (minutes) <=88%: 2.7 min  Pulse Rate Statistics:   Pulse Mean (bpm):    74/min    Pulse Range (62-108/min)   IMPRESSION: OSA (obstructive sleep apnea)   RECOMMENDATION:  This home sleep test demonstrates moderate obstructive sleep apnea with a total AHI of 19.4/hour and O2 nadir of 85%.  Snore channel, sensor was not reliable during this test. Treatment with positive airway pressure is recommended. The patient will be advised to proceed with an autoPAP titration/trial at home for now. A full night titration study may be considered to optimize treatment settings, if needed down the road. Please note that untreated obstructive sleep apnea may carry additional perioperative morbidity. Patients with significant obstructive sleep apnea should receive perioperative PAP therapy and the surgeons and particularly the anesthesiologist should be informed of the diagnosis and the  severity of the sleep disordered breathing.  Alternative treatment options may include dental treatment with the help of a dentist to consider an oral appliance or surgical treatment option such as inspire through ENT.  Weight loss may help in reducing the sleep apnea severity and snoring. The patient should be cautioned not to drive, work at heights, or operate dangerous or heavy equipment when tired or sleepy. Review and reiteration of good sleep hygiene measures should be pursued with any patient. Other causes of the patient's symptoms, including circadian rhythm disturbances, an underlying mood disorder, medication effect and/or an underlying medical problem cannot be ruled out based on this test. Clinical correlation is recommended. The patient and her referring provider will be notified of the test results. The patient will be seen in follow up in sleep clinic at [facility].  I certify that I have reviewed the raw data recording prior to the issuance of this report in accordance with the standards of the American Academy of Sleep Medicine (AASM).   INTERPRETING PHYSICIAN:   Star Age, MD, PhD  Board Certified in Neurology and Sleep Medicine  [facility] [address], [place], [place] [address] [phone]

## 2021-12-05 ENCOUNTER — Telehealth: Payer: Self-pay | Admitting: *Deleted

## 2021-12-05 NOTE — Telephone Encounter (Signed)
-----   Message from Star Age, MD sent at 12/01/2021  1:05 PM EST ----- Patient referred by Dr. Baird Cancer, seen by me on 10/26/21, patient had a HST on 11/29/21.    Please call and notify the patient that the recent home sleep test showed obstructive sleep apnea in the moderate range. I recommend treatment in the form of autoPAP, which means, that we don't have to bring her in for a sleep study with CPAP, but will let her start using a so called autoPAP machine at home, which is a CPAP-like machine with self-adjusting pressures. We will send the order to a local DME company (of her choice, or as per insurance requirement). The DME representative will fit her with a mask, educate her on how to use the machine, how to put the mask on, etc. I have placed an order in the chart. Please send the order, talk to patient, send report to referring MD. We will need a FU in sleep clinic for 10 weeks post-PAP set up, please arrange that with me or one of our NPs. Also reinforce the need for compliance with treatment. Thanks,   Star Age, MD, PhD [facility])

## 2021-12-05 NOTE — Telephone Encounter (Signed)
I called the pt to discuss results of sleep study. We were not able to complete the call because patient had another obligation but she stated she would call us back.

## 2021-12-06 ENCOUNTER — Telehealth: Payer: Self-pay | Admitting: *Deleted

## 2021-12-06 NOTE — Telephone Encounter (Signed)
-----   Message from Star Age, MD sent at 12/01/2021  1:05 PM EST ----- Patient referred by Dr. Baird Cancer, seen by me on 10/26/21, patient had a HST on 11/29/21.    Please call and notify the patient that the recent home sleep test showed obstructive sleep apnea in the moderate range. I recommend treatment in the form of autoPAP, which means, that we don't have to bring her in for a sleep study with CPAP, but will let her start using a so called autoPAP machine at home, which is a CPAP-like machine with self-adjusting pressures. We will send the order to a local DME company (of her choice, or as per insurance requirement). The DME representative will fit her with a mask, educate her on how to use the machine, how to put the mask on, etc. I have placed an order in the chart. Please send the order, talk to patient, send report to referring MD. We will need a FU in sleep clinic for 10 weeks post-PAP set up, please arrange that with me or one of our NPs. Also reinforce the need for compliance with treatment. Thanks,   Star Age, MD, PhD [facility])

## 2021-12-06 NOTE — Telephone Encounter (Signed)
LMVM fmobile/home or pt to return call for her sleep study results.

## 2021-12-07 ENCOUNTER — Telehealth: Payer: Self-pay

## 2021-12-07 NOTE — Telephone Encounter (Signed)
Patient called stating she tested positive for covid this morning she stated she has a headache, sore throat, nightsweats, chills and cough. She wanted to know if she needs covid treatment.   I returned pt call and advised her if she would like to get paxlovid she can. She was made aware that sometimes patient's symptoms reoccur. Pt understood and declined treatment she will continue taking OTC meds, she was advised to make sure she avoids diary products, sodas and advised to drink one hot beverage daily. She was also advised that if she develops any SOB, chest pain or leg pain to go to the ER. Tyler Deis

## 2021-12-07 NOTE — Telephone Encounter (Signed)
Called pt & LVM (ok per DPR) with ss results and asked for call back or send [place] a mychart message back to discuss the next steps regarding getting started on autoPAP. Left office number in message.

## 2021-12-25 ENCOUNTER — Encounter: Payer: Self-pay | Admitting: *Deleted

## 2021-12-25 NOTE — Telephone Encounter (Signed)
Spoke with the patient and discussed sleep study results.  Patient verbalized understanding and she is amenable to proceeding with AutoPap therapy.  Patient did not have a preference of DME company so I advised we would send the orders over to [facility].  Patient understands insurance compliance requirements which includes using the machine at least 4 hours every night and being seen in the clinic 31 to 90 days after set up.  The patient was driving so she was unable to schedule an initial f/u appointment at this time but I advised her to give [place] a call as soon as she receives her machine and we can schedule her.  The patient is also aware she will receive a welcome call from [facility] within 48 hours. She was very Patent attorney.   Orders faxed to [facility]. Received a receipt of confirmation. Letter mailed to pt.

## 2021-12-26 IMAGING — US US ABDOMEN LIMITED
1 series · 14 of 25 positions shown · non-contrast
Comparison: 03/15/2017

CLINICAL DATA: Elevated LFTs

EXAM:
ULTRASOUND ABDOMEN LIMITED RIGHT UPPER QUADRANT

[Series 1: us abdomen limited · 0.20mm/px · 14 of 43 slices shown]
[im 1/43]
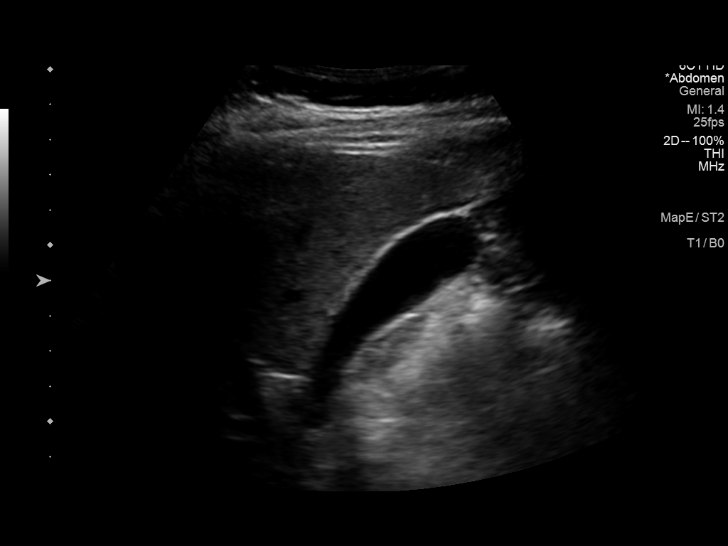
[im 4/43]
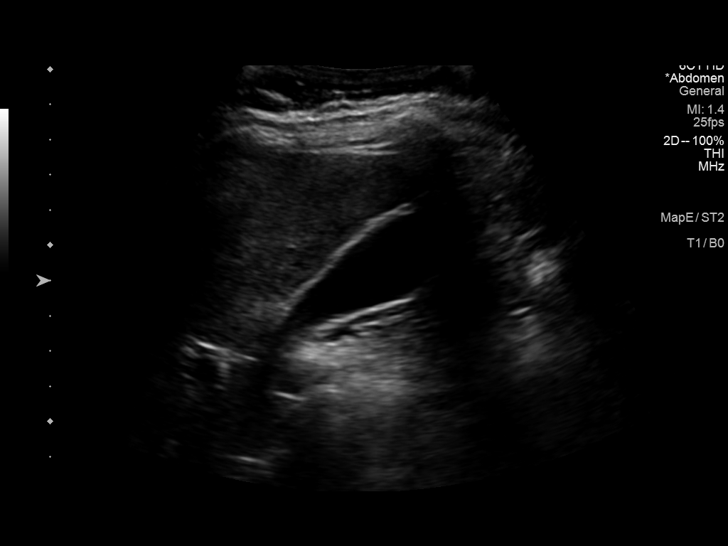
[im 8/43]
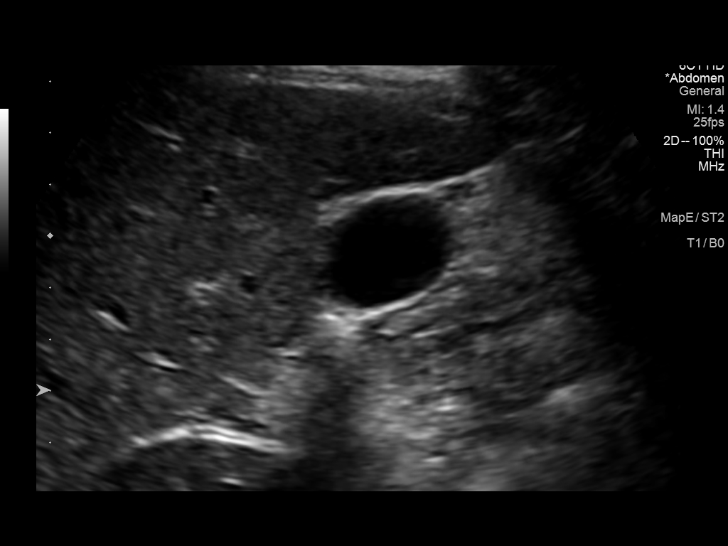
[im 11/43]
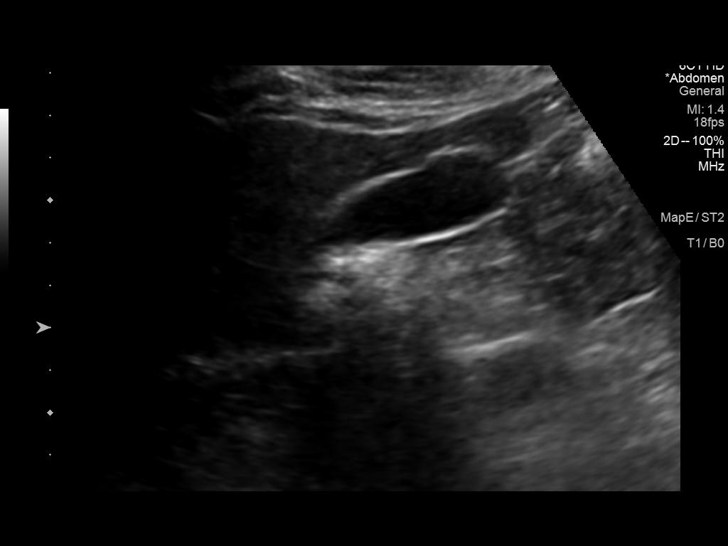
[im 15/43]
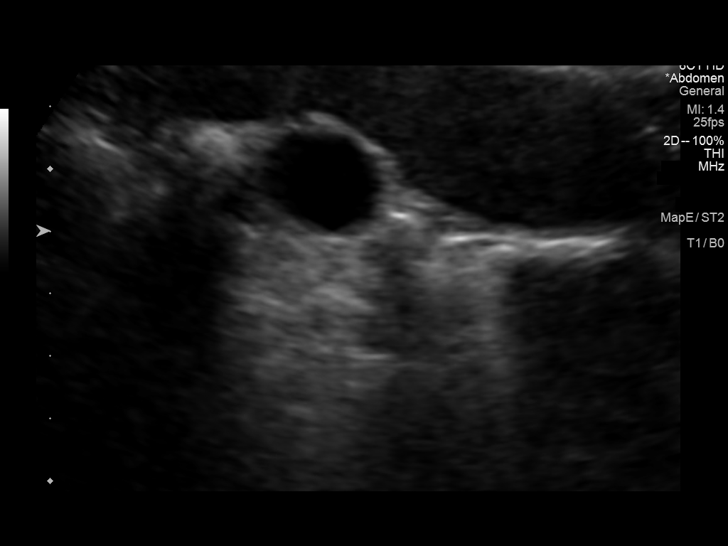
[im 16/43]
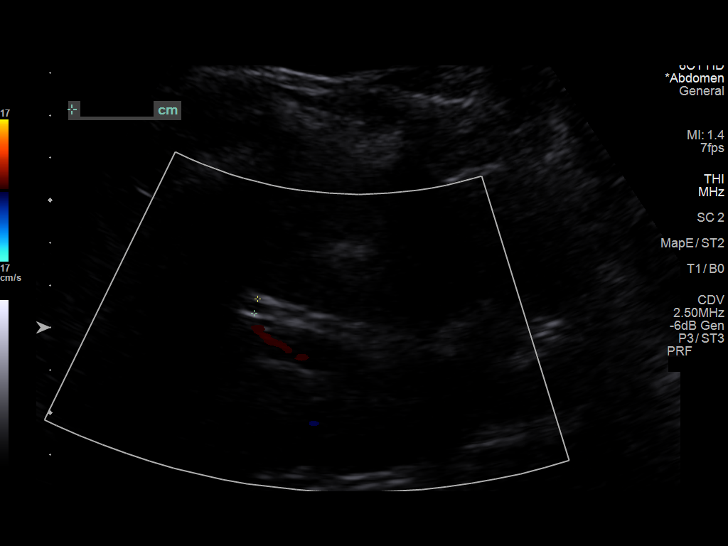
[im 20/43]
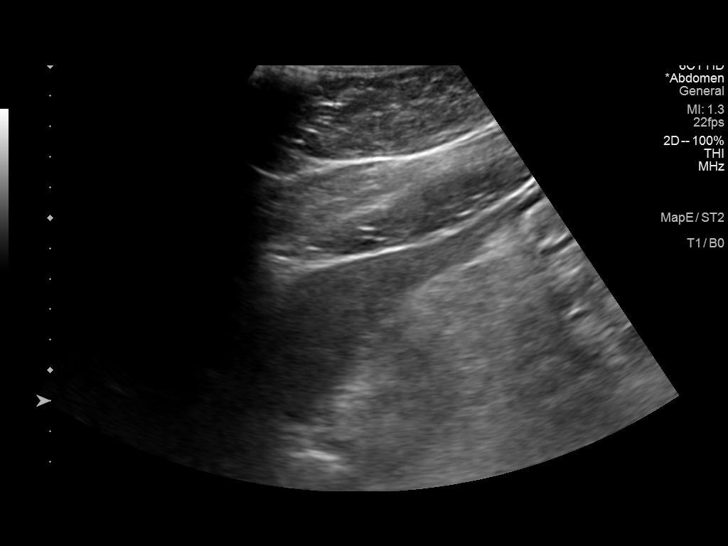
[im 23/43]
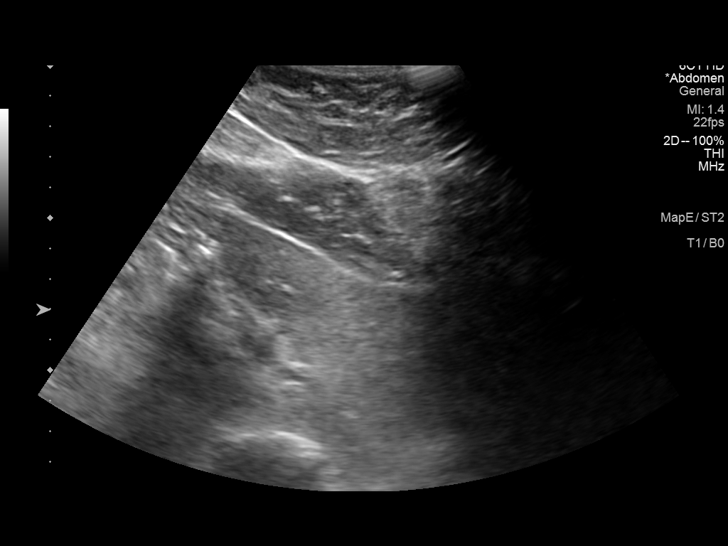
[im 27/43]
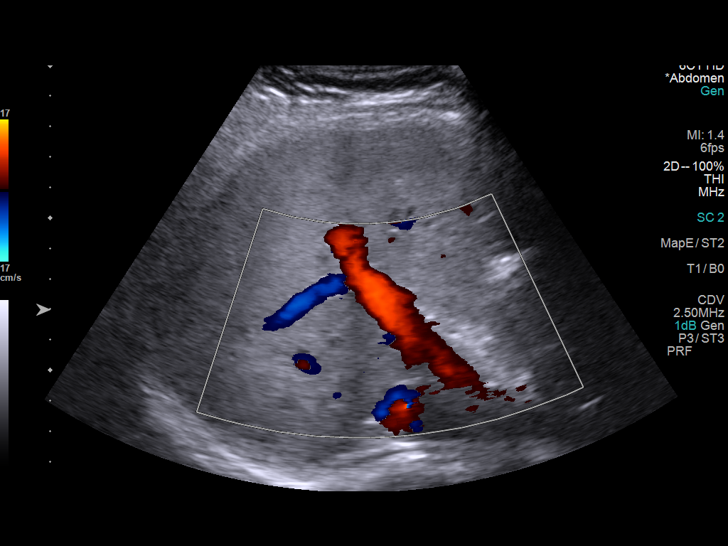
[im 29/43]
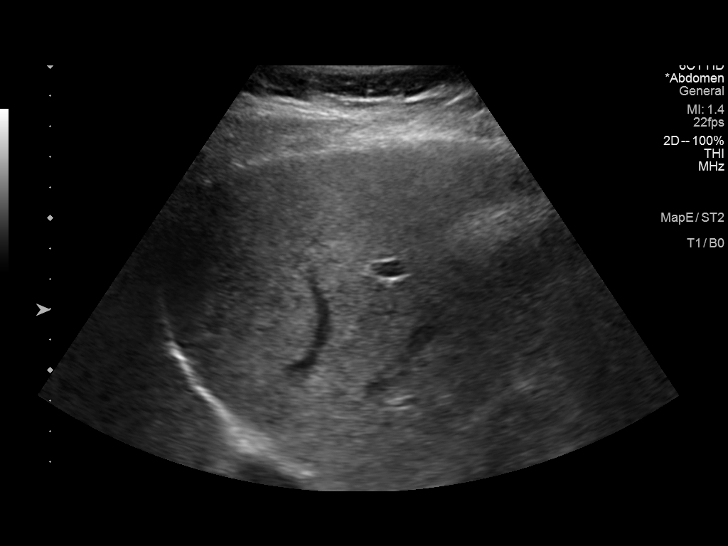
[im 32/43]
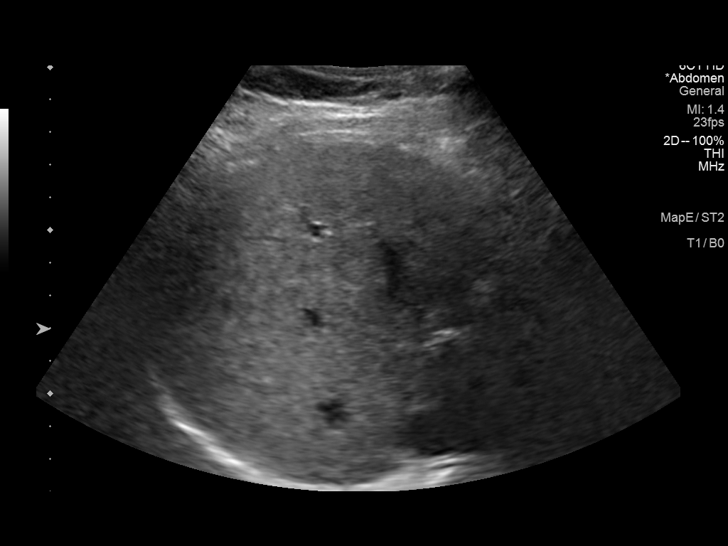
[im 36/43]
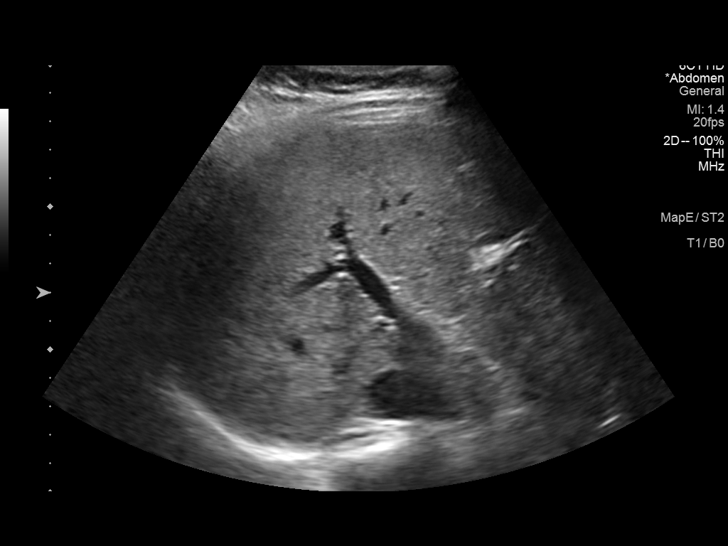
[im 39/43]
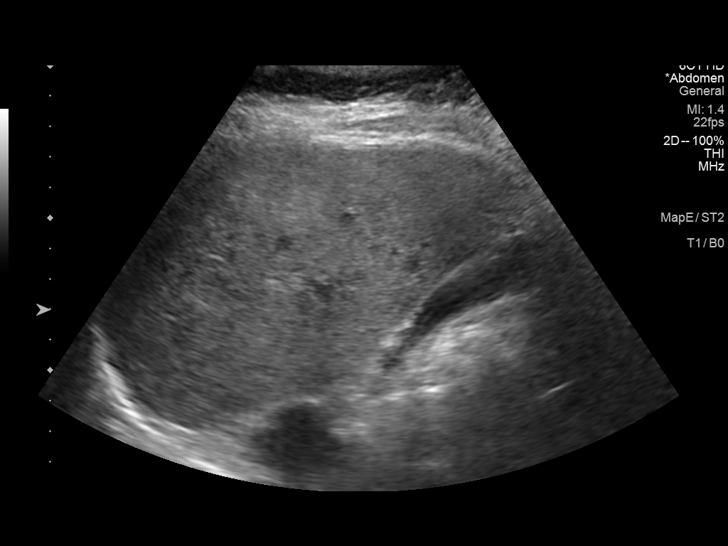
[im 43/43]
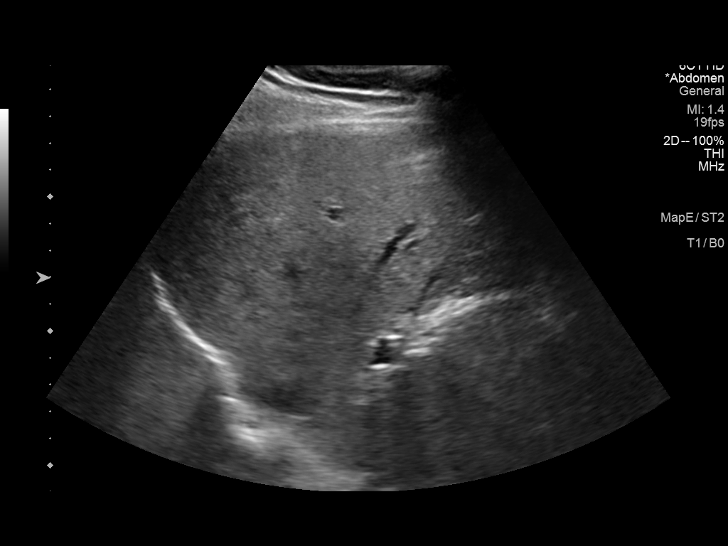

[14 of 25 positions shown; findings below may reference images not displayed]

FINDINGS: Gallbladder:

No gallstones or wall thickening visualized. No sonographic Murphy
sign noted by sonographer.

Common bile duct:

Diameter: 3.5 mm

Liver:

No focal hepatic abnormality. Increased hepatic echogenicity
suggesting mild hepatic steatosis. No intrahepatic biliary
dilatation. No surrounding free fluid or ascites. Portal vein is
patent on color Doppler imaging with normal direction of blood flow
towards the liver.

Other: None.
IMPRESSION: Minimal increased hepatic echogenicity may represent mild fatty
infiltration.

No other acute finding by ultrasound.

## 2021-12-27 ENCOUNTER — Ambulatory Visit: Payer: BC Managed Care – PPO | Admitting: Nurse Practitioner

## 2021-12-27 ENCOUNTER — Encounter: Payer: Self-pay | Admitting: Nurse Practitioner

## 2021-12-27 ENCOUNTER — Other Ambulatory Visit: Payer: Self-pay

## 2021-12-27 VITALS — BP 126/68 | HR 83 | Temp 98.0°F | Ht 66.0 in | Wt 174.6 lb

## 2021-12-27 DIAGNOSIS — R1011 Right upper quadrant pain: Secondary | ICD-10-CM

## 2021-12-27 DIAGNOSIS — R1012 Left upper quadrant pain: Secondary | ICD-10-CM | POA: Diagnosis not present

## 2021-12-27 DIAGNOSIS — M79605 Pain in left leg: Secondary | ICD-10-CM

## 2021-12-27 DIAGNOSIS — K760 Fatty (change of) liver, not elsewhere classified: Secondary | ICD-10-CM | POA: Diagnosis not present

## 2021-12-27 DIAGNOSIS — R7303 Prediabetes: Secondary | ICD-10-CM

## 2021-12-27 DIAGNOSIS — R051 Acute cough: Secondary | ICD-10-CM

## 2021-12-27 MED ORDER — BENZONATATE 100 MG PO CAPS: 100.0000 mg | ORAL_CAPSULE | Freq: Three times a day (TID) | ORAL | 1 refills | Status: AC | PRN

## 2021-12-27 NOTE — Progress Notes (Signed)
I,Tianna Badgett,acting as a Education administrator for [employer], NP.,have documented all relevant documentation on the behalf of [employer], NP,as directed by  Bary Castilla, NP while in the presence of Bary Castilla, NP.  This visit occurred during the SARS-CoV-2 public health emergency.  Safety protocols were in place, including screening questions prior to the visit, additional usage of staff PPE, and extensive cleaning of exam room while observing appropriate contact time as indicated for disinfecting solutions.  Subjective:     Patient ID: Sheryl [name] , female    DOB: 13-May-1966 , 56 y.o.   MRN: [id number]   Chief Complaint  Patient presents with   Leg Pain   HPI  Patient is here for stomach pain and leg pain. She denies any swelling.  She had tested positive during christmas break. She still has some cough from it. Denies any other COVID symptoms. She then starting having some left leg pain. Denies shortness of breath or chest pain. She does have abdominal pain. She has a history of elevated liver enzymes in the past. She saw Dr. Beverley Fiedler (GI) last year. They did multiple tests on her. [place] did show hepatic stenosis. Denies any blood in the stool, GI bleeding.   Leg Pain  The incident occurred more than 1 week ago. There was no injury mechanism. The pain is present in the left hip and left thigh. The quality of the pain is described as shooting. The pain is at a severity of 3/10. The pain is mild. The pain has been Fluctuating since onset. Nothing aggravates the symptoms. She has tried nothing for the symptoms.    Past Medical History:  Diagnosis Date   Dysmenorrhea    Endocervical polyp    Fibroids    GERD (gastroesophageal reflux disease)    diet controlled   H/O varicella    History of chicken pox    History of measles    History of measles, mumps, or rubella    History of mumps    Menorrhagia    Nocturia    Unstable bladder    Urinary frequency    Yeast  infection      Family History  Problem Relation Age of Onset   Diverticulitis Mother    Diabetes Father    Diabetes Paternal Grandfather    Sleep apnea Neg Hx      Current Outpatient Medications:    Ascorbic Acid (VITAMIN C PO), Take by mouth., Disp: , Rfl:    benzonatate (TESSALON PERLES) 100 MG capsule, Take 1 capsule (100 mg total) by mouth 3 (three) times daily as needed for cough., Disp: 30 capsule, Rfl: 1   Biotin w/ Vitamins C & E (HAIR/SKIN/NAILS PO), Take by mouth., Disp: , Rfl:    Calcium Carbonate (CALCIUM 600 PO), Take by mouth. 1 daily, Disp: , Rfl:    Cholecalciferol (VITAMIN D3 PO), 5,000 Units daily. , Disp: , Rfl:    clonazePAM (KLONOPIN) 0.5 MG tablet, Take 1 tablet (0.5 mg total) by mouth 2 (two) times daily. Prn anxiety, Disp: 30 tablet, Rfl: 0   Cyanocobalamin (B-12 PO), Take by mouth., Disp: , Rfl:    EPINEPHrine (EPIPEN 2-PAK) 0.3 mg/0.3 mL IJ SOAJ injection, EpiPen 2-Pak 0.3 mg/0.3 mL injection, auto-injector, Disp: 1 each, Rfl: 2   fluticasone (FLONASE) 50 MCG/ACT nasal spray, 2 sprays via each nostril daily, Disp: 16 g, Rfl: 1   ibuprofen (ADVIL,MOTRIN) 600 MG tablet, 1 po pc q 6 hours x 5 days then prn-pain, Disp: 30 tablet,  Rfl: 1   levocetirizine (XYZAL) 5 MG tablet, Take 1 tablet (5 mg total) by mouth every evening., Disp: 90 tablet, Rfl: 1   MILK THISTLE PO, Take by mouth., Disp: , Rfl:    montelukast (SINGULAIR) 10 MG tablet, TAKE 1 TABLET BY MOUTH EVERY DAY, Disp: 90 tablet, Rfl: 1   Multiple Vitamins-Minerals (MULTIVITAMIN ADULTS PO), multivitamin, Disp: , Rfl:    Probiotic Product (RESTORA) CAPS, TAKE ONE CAPSULE BY MOUTH EVERY DAY, Disp: 30 capsule, Rfl: 5   TURMERIC PO, Take by mouth., Disp: , Rfl:    Allergies  Allergen Reactions   Almond (Diagnostic) Anaphylaxis   Cat Hair Extract    Milk-Related Compounds      Review of Systems  Constitutional: Negative.  Negative for chills and fever.  HENT:  Negative for congestion.   Respiratory:   Positive for cough. Negative for shortness of breath and wheezing.   Cardiovascular: Negative.  Negative for chest pain, palpitations and leg swelling.  Gastrointestinal:  Positive for abdominal pain. Negative for blood in stool, constipation and diarrhea.       Upper quadrant pain   Endocrine: Negative for polydipsia, polyphagia and polyuria.  Musculoskeletal:  Positive for myalgias.       Left leg pain   Neurological: Negative.  Negative for weakness and headaches.    Today's Vitals   12/27/21 1131  BP: 126/68  Pulse: 83  Temp: 98 F (36.7 C)  TempSrc: Oral  Weight: 174 lb 9.6 oz (79.2 kg)  Height: '5\' 6"'  (1.676 m)   Body mass index is 28.18 kg/m.  Wt Readings from Last 3 Encounters:  12/27/21 174 lb 9.6 oz (79.2 kg)  10/26/21 179 lb 6.4 oz (81.4 kg)  08/03/21 175 lb 6.4 oz (79.6 kg)    Objective:  Physical Exam Constitutional:      Appearance: Normal appearance. She is obese.  HENT:     Head: Normocephalic and atraumatic.  Cardiovascular:     Rate and Rhythm: Normal rate and regular rhythm.     Pulses: Normal pulses.     Heart sounds: Normal heart sounds. No murmur heard. Pulmonary:     Effort: Pulmonary effort is normal. No respiratory distress.     Breath sounds: Normal breath sounds. No wheezing.  Abdominal:     General: Bowel sounds are normal.     Tenderness: There is abdominal tenderness in the right upper quadrant. There is no guarding or rebound. Negative signs include Murphy's sign, Rovsing's sign and McBurney's sign.     Comments: Tenderness pain upon palpitation to upper right and left quadrant   Musculoskeletal:        General: No swelling.  Skin:    General: Skin is warm and dry.     Capillary Refill: Capillary refill takes less than 2 seconds.     Coloration: Skin is not jaundiced.  Neurological:     Mental Status: She is alert and oriented to person, place, and time.        Assessment And Plan:     1. Pain of left lower extremity -Denies SOB,  chest pain.  -was recently diagnosed with COVID during christmas break;  -Will check for DVT  - D-dimer, quantitative - VAS [place] LOWER EXTREMITY VENOUS (DVT); Future  2. Steatosis of liver -Hx of Steatosis of liver. Has seen GI specialist 4/22 Dr. Beverley Fiedler  -Advise pt to follow up with Dr. Beverley Fiedler (GI)  - CBC no Diff - CMP14+EGFR - Gamma GT  3. Abdominal  pain, bilateral upper quadrant -Follow up with GI Dr. Caprice Red. Patient will call to make an appt.  -Will consider abdominal US due to her history after reviewing labs.  - CBC no Diff - CMP14+EGFR - Gamma GT  4. Prediabetes - Will check labs and assess.  - Hemoglobin A1c  5. Acute cough -post covid cough  - benzonatate (TESSALON PERLES) 100 MG capsule; Take 1 capsule (100 mg total) by mouth 3 (three) times daily as needed for cough.  Dispense: 30 capsule; Refill: 1   The patient was encouraged to call or send a message through [facility] for any questions or concerns  Follow up: advised patient if pain gets worse to go the nearest urgent care or emergency room.   Side effects and appropriate use of all the medication(s) were discussed with the patient today. Patient advised to use the medication(s) as directed by their healthcare provider. The patient was encouraged to read, review, and understand all associated package inserts and contact our office with any questions or concerns. The patient accepts the risks of the treatment plan and had an opportunity to ask questions.   Patient was given opportunity to ask questions. Patient verbalized understanding of the plan and was able to repeat key elements of the plan. All questions were answered to their satisfaction.  Raman Darel Ricketts, DNP   I, Raman Taylyn Brame have reviewed all documentation for this visit. The documentation on 1/11//22 for the exam, diagnosis, procedures, and orders are all accurate and complete.    IF YOU HAVE BEEN REFERRED TO A SPECIALIST, IT MAY TAKE 1-2 WEEKS TO  SCHEDULE/PROCESS THE REFERRAL. IF YOU HAVE NOT HEARD FROM US/SPECIALIST IN TWO WEEKS, PLEASE GIVE [place] A CALL AT [phone] X [phone].   THE PATIENT IS ENCOURAGED TO PRACTICE SOCIAL DISTANCING DUE TO THE COVID-19 PANDEMIC.

## 2021-12-27 NOTE — Patient Instructions (Signed)
Pain Scale Information, Adult A pain scale is a tool to help you describe your pain. It often uses pictures, numbers, or words. It can help you explain to your health care provider: How bad your pain is. What your pain feels like, such as dull, achy, throbbing, or sharp. Where pain is located in your body. How often you have pain. Pain scales range from simple to complex and are often in the form of a survey for adults. Which pain scale your health care provider uses depends on your condition. Some pain scales measure only pain intensity. These can be useful if the cause of your pain is known. Other pain scales measure more factors, including if you can do your usual activities and how the pain is affecting your mood. These scales are useful if you have long-term (chronic) pain. How is a pain scale used? A pain scale is used to help your health care provider guide your treatment plan. It can be used in a health care setting or at home. In a health care setting, your health care provider will ask you the questions on the pain scale or have you fill out a form. At home, you will be asked to record your pain symptoms. If you have chronic pain, you may have to use the pain scale for several weeks or months. Recording your pain symptoms helps your health care provider see how your pain changes over time. Why is it important to communicate about pain? Talking about your pain helps your health care provider understand what you are feeling and how to treat your condition. Being in pain can make you feel unwell and unhappy. It can also interfere with your daily activities, such as work, school, or hobbies. Your relationships can also be affected. Your health care provider wants to help control your pain to improve your mood and daily life. What are some questions to ask my health care provider? How accurate are the results of this pain scale? How often should I use a pain scale? What is causing my pain? How  long will I need treatment? What are the risks of treatment with medicines? What other treatments can help? What if my pain does not go away with treatment? Should I keep a record of pain symptoms or pain scale results at home? Summary A pain scale is a tool to help you describe your pain. Talking about your pain helps your health care provider understand what you are feeling and how to treat your condition. Your health care provider wants to help control your pain to improve your mood and daily life. This information is not intended to replace advice given to you by your health care provider. Make sure you discuss any questions you have with your health care provider. Document Revised: 10/05/2019 Document Reviewed: 10/05/2019 Elsevier Patient Education  [employer].

## 2021-12-28 LAB — CMP14+EGFR
ALT: 45 IU/L — ABNORMAL HIGH (ref 0–32)
AST: 30 IU/L (ref 0–40)
Albumin/Globulin Ratio: 1.7 (ref 1.2–2.2)
Albumin: 4.7 g/dL (ref 3.8–4.9)
Alkaline Phosphatase: 186 IU/L — ABNORMAL HIGH (ref 44–121)
BUN/Creatinine Ratio: 16 (ref 9–23)
BUN: 12 mg/dL (ref 6–24)
Bilirubin Total: 0.6 mg/dL (ref 0.0–1.2)
CO2: 25 mmol/L (ref 20–29)
Calcium: 9.9 mg/dL (ref 8.7–10.2)
Chloride: 101 mmol/L (ref 96–106)
Creatinine, Ser: 0.74 mg/dL (ref 0.57–1.00)
Globulin, Total: 2.8 g/dL (ref 1.5–4.5)
Glucose: 81 mg/dL (ref 70–99)
Potassium: 4.2 mmol/L (ref 3.5–5.2)
Sodium: 143 mmol/L (ref 134–144)
Total Protein: 7.5 g/dL (ref 6.0–8.5)
eGFR: 95 mL/min/{1.73_m2} (ref >59)

## 2021-12-28 LAB — CBC
Hematocrit: 41.7 % (ref 34.0–46.6)
Hemoglobin: 14.2 g/dL (ref 11.1–15.9)
MCH: 31.1 pg (ref 26.6–33.0)
MCHC: 34.1 g/dL (ref 31.5–35.7)
MCV: 91 fL (ref 79–97)
Platelets: 290 10*3/uL (ref 150–450)
RBC: 4.56 x10E6/uL (ref 3.77–5.28)
RDW: 13.4 % (ref 11.7–15.4)
WBC: 5.2 10*3/uL (ref 3.4–10.8)

## 2021-12-28 LAB — D-DIMER, QUANTITATIVE: D-DIMER: 0.23 mg/L FEU (ref 0.00–0.49)

## 2021-12-28 LAB — GAMMA GT: GGT: 253 IU/L — ABNORMAL HIGH (ref 0–60)

## 2021-12-28 LAB — HEMOGLOBIN A1C
Est. average glucose Bld gHb Est-mCnc: 111 mg/dL
Hgb A1c MFr Bld: 5.5 % (ref 4.8–5.6)

## 2021-12-28 NOTE — Addendum Note (Signed)
Addended by: Melba Coon on: 12/28/2021 12:43 PM   Modules accepted: Orders

## 2022-01-01 ENCOUNTER — Ambulatory Visit (HOSPITAL_COMMUNITY)
Admission: RE | Admit: 2022-01-01 | Discharge: 2022-01-01 | Disposition: A | Discharge status: Home / Self Care | Payer: BC Managed Care – PPO | Source: Ambulatory Visit | Attending: Cardiology | Admitting: Cardiology

## 2022-01-01 ENCOUNTER — Other Ambulatory Visit: Payer: Self-pay

## 2022-01-01 DIAGNOSIS — M79605 Pain in left leg: Secondary | ICD-10-CM | POA: Diagnosis not present

## 2022-01-11 ENCOUNTER — Ambulatory Visit
Admission: RE | Admit: 2022-01-11 | Discharge: 2022-01-11 | Disposition: A | Discharge status: Home / Self Care | Payer: BC Managed Care – PPO | Source: Ambulatory Visit | Attending: Nurse Practitioner | Admitting: Nurse Practitioner

## 2022-01-11 DIAGNOSIS — K760 Fatty (change of) liver, not elsewhere classified: Secondary | ICD-10-CM

## 2022-01-11 DIAGNOSIS — R1011 Right upper quadrant pain: Secondary | ICD-10-CM

## 2022-01-16 ENCOUNTER — Telehealth: Payer: Self-pay | Admitting: *Deleted

## 2022-01-16 NOTE — Telephone Encounter (Signed)
DME: [facility] Ph: [phone] Fax: [phone] Airsense 10 Auto Setup 01/08/22 (initial appt needed 02/09/22-04/08/22)  Called pt & LVM asking for call back to schedule appointment. Also sent mychart message.

## 2022-01-23 ENCOUNTER — Encounter: Payer: Self-pay | Admitting: *Deleted

## 2022-01-23 NOTE — Telephone Encounter (Signed)
Pt called back and has now been scheduled for 04-03 pt told to bring CPAP and power cord and to check in at 8:15

## 2022-01-23 NOTE — Telephone Encounter (Signed)
Wonderful, thank you

## 2022-01-23 NOTE — Telephone Encounter (Signed)
Letter mailed to pts home. 

## 2022-01-23 NOTE — Telephone Encounter (Signed)
Phone rep called pt (this would make the 2nd attempt) to schedule the initial CPAP f/u.  Office # left for a call back.

## 2022-03-01 ENCOUNTER — Other Ambulatory Visit: Payer: Self-pay

## 2022-03-01 MED ORDER — EPINEPHRINE 0.3 MG/0.3ML IJ SOAJ: INTRAMUSCULAR | 2 refills | Status: DC

## 2022-03-19 ENCOUNTER — Ambulatory Visit: Payer: BC Managed Care – PPO | Admitting: Neurology

## 2022-03-28 ENCOUNTER — Telehealth: Payer: Self-pay | Admitting: Neurology

## 2022-03-28 NOTE — Telephone Encounter (Signed)
Pt called and states she cannot make her 04/18/22 appt due to a flight being scheduled. Pt would like to be fit in before 6/11 due to CPAP follow up date.  ?Pt states Dr. Jaynee Eagles had to cancel original appt. Pt would like a call back.  ?

## 2022-03-29 NOTE — Telephone Encounter (Signed)
Clarification: pt sees Dr Rexene Alberts. She can see any nurse practitioner for this initial autoPAP follow-up. Please call patient and schedule in any opening with any nurse practitioner before 05/27/22.  ?

## 2022-04-18 ENCOUNTER — Ambulatory Visit: Payer: BC Managed Care – PPO | Admitting: Neurology

## 2022-05-01 ENCOUNTER — Ambulatory Visit: Payer: BC Managed Care – PPO | Admitting: Neurology

## 2022-05-01 ENCOUNTER — Encounter: Payer: Self-pay | Admitting: Neurology

## 2022-05-01 VITALS — BP 135/90 | HR 75 | Ht 66.0 in | Wt 177.8 lb

## 2022-05-01 DIAGNOSIS — Z9989 Dependence on other enabling machines and devices: Secondary | ICD-10-CM | POA: Diagnosis not present

## 2022-05-01 DIAGNOSIS — G4733 Obstructive sleep apnea (adult) (pediatric): Secondary | ICD-10-CM

## 2022-05-01 NOTE — Progress Notes (Signed)
Subjective:  ?  ?Patient ID: Sheryl [name] is a 56 y.o. female. ? ?HPI ? ? ? ?Interim history:  ?Sheryl [name] is a 56 year old right-handed woman with an underlying medical history of reflux disease, urinary frequency, uterine fibroids, hip pain, and overweight state, who presents for follow-up consultation of her obstructive sleep apnea after interim testing and starting AutoPap therapy.  The patient is unaccompanied today.  I first met her at the request of her primary care physician on 10/26/2021, at which time she reported snoring, morning headaches, nonrestorative sleep and daytime somnolence.  She was advised to proceed with a sleep study.  She had a home sleep test on 11/29/2021 which indicated moderate obstructive sleep apnea with an AHI of 19.4/h, O2 nadir 85%.  She was advised to proceed with AutoPap therapy.  We will set up date was 01/08/2022.  She has a ResMed AirSense 10 AutoSet machine. ? ?Today, 05/01/2022: I reviewed her AutoPap compliance data from 01/08/2022 through 02/06/2022, which is a total of 30 days, during which time she used her machine every night with percent use days greater than 4 hours at 100%, indicating superb compliance with an average usage of 7 hours and 4 minutes, residual AHI at goal at 3/h, average pressure for the 95th percentile at 10.1 cm with a range of 5 to 11 cm with EPR of 3.  Leak acceptable with the 95th percentile at 6.5 L/min.  I also reviewed her most recent compliance data for the past month, and she has had excellent compliance.  She reports doing well with her AutoPap and has adjusted well.  She is motivated to continue with treatment and has benefited from it, in particular, her headaches have improved, she has better energy during the day.  She is very motivated to continue with treatment.  She is using a small fullface mask from [employer], [place].  She does have some problems keeping the mask in place, sometimes the headgear slides off.  She is a side sleeper.  She  has tried to tighten the straps but then she has painful indentations from the mass.  She would like to try something different if possible.  She was provided with a Evora fullface mask from [place] as a sample today. ? ?The patient's allergies, current medications, family history, past medical history, past social history, past surgical history and problem list were reviewed and updated as appropriate.  ? ?Previously:  ? ?10/26/21: (She) reports snoring, nonrestorative sleep, morning headaches, and excessive daytime somnolence.  I reviewed your office note from 08/03/2021.  Her Epworth sleepiness score is 7 out of 24, fatigue severity score is 36 out of 63.  ?She has had worsening daytime tiredness.  She has a family history of snoring.  She has gained weight in the past couple of years in the realm of 20+ pounds.  She reports that she snored even before more weight gain.  She has nocturia about twice per average night.  She occasionally wakes up with a dull, achy headache, not debilitating but enough to take a Tylenol as needed.  She does not have a TV in her bedroom, no pets in the house.  She lives alone, she has a 28 year old son and 1 grandchild.  She works for a [employer].  She works from home.  Bedtime is generally around 10 PM, rise time around 7 AM.  She drinks caffeine in the form of coffee, 1 cup/day on average, 1 soda per day on average.  She drinks  alcohol occasionally, she is a non-smoker. ? ? ?Her Past Medical History Is Significant For: ?Past Medical History:  ?Diagnosis Date  ? Dysmenorrhea   ? Endocervical polyp   ? Fibroids   ? GERD (gastroesophageal reflux disease)   ? diet controlled  ? H/O varicella   ? History of chicken pox   ? History of measles   ? History of measles, mumps, or rubella   ? History of mumps   ? Menorrhagia   ? Nocturia   ? Unstable bladder   ? Urinary frequency   ? Yeast infection   ? ? ?Her Past Surgical History Is Significant For: ?Past Surgical History:   ?Procedure Laterality Date  ? MYOMECTOMY N/A 10/28/2013  ? Procedure: MYOMECTOMY;  Surgeon: Ena Dawley, MD;  Location: [facility];  Service: Gynecology;  Laterality: N/A;  ? ? ?Her Family History Is Significant For: ?Family History  ?Problem Relation Age of Onset  ? Diverticulitis Mother   ? Diabetes Father   ? Diabetes Paternal Grandfather   ? Sleep apnea Neg Hx   ? ? ?Her Social History Is Significant For: ?Social History  ? ?Socioeconomic History  ? Marital status: Single  ?  Spouse name: Not on file  ? Number of children: Not on file  ? Years of education: Not on file  ? Highest education level: Not on file  ?Occupational History  ? Not on file  ?Tobacco Use  ? Smoking status: Never  ? Smokeless tobacco: Never  ?Vaping Use  ? Vaping Use: Never used  ?Substance and Sexual Activity  ? Alcohol use: Yes  ?  Comment: 3 glasses of wine montly  ? Drug use: No  ? Sexual activity: Not on file  ?Other Topics Concern  ? Not on file  ?Social History Narrative  ? Not on file  ? ?Social Determinants of Health  ? ?Financial Resource Strain: Not on file  ?Food Insecurity: Not on file  ?Transportation Needs: Not on file  ?Physical Activity: Not on file  ?Stress: Not on file  ?Social Connections: Not on file  ? ? ?Her Allergies Are:  ?Allergies  ?Allergen Reactions  ? Almond (Diagnostic) Anaphylaxis  ? Cat Hair Extract   ? Milk-Related Compounds   ?:  ? ?Her Current Medications Are:  ?Outpatient Encounter Medications as of 05/01/2022  ?Medication Sig  ? Ascorbic Acid (VITAMIN C PO) Take by mouth.  ? benzonatate (TESSALON PERLES) 100 MG capsule Take 1 capsule (100 mg total) by mouth 3 (three) times daily as needed for cough.  ? Biotin w/ Vitamins C & E (HAIR/SKIN/NAILS PO) Take by mouth.  ? Calcium Carbonate (CALCIUM 600 PO) Take by mouth. 1 daily  ? Cholecalciferol (VITAMIN D3 PO) 5,000 Units daily.   ? clonazePAM (KLONOPIN) 0.5 MG tablet Take 1 tablet (0.5 mg total) by mouth 2 (two) times daily. Prn anxiety  ? Cyanocobalamin  (B-12 PO) Take by mouth.  ? EPINEPHrine (EPIPEN 2-PAK) 0.3 mg/0.3 mL IJ SOAJ injection EpiPen 2-Pak 0.3 mg/0.3 mL injection, auto-injector  ? fluticasone (FLONASE) 50 MCG/ACT nasal spray 2 sprays via each nostril daily  ? ibuprofen (ADVIL,MOTRIN) 600 MG tablet 1 po pc q 6 hours x 5 days then prn-pain  ? levocetirizine (XYZAL) 5 MG tablet Take 1 tablet (5 mg total) by mouth every evening.  ? MILK THISTLE PO Take by mouth.  ? montelukast (SINGULAIR) 10 MG tablet TAKE 1 TABLET BY MOUTH EVERY DAY  ? Multiple Vitamins-Minerals (MULTIVITAMIN ADULTS PO) multivitamin  ?  Probiotic Product (RESTORA) CAPS TAKE ONE CAPSULE BY MOUTH EVERY DAY  ? TURMERIC PO Take by mouth.  ? ?No facility-administered encounter medications on file as of 05/01/2022.  ?: ? ?Review of Systems:  ?Out of a complete 14 point review of systems, all are reviewed and negative with the exception of these symptoms as listed below:  ? ?Review of Systems  ?Neurological:   ?     Pt is here for CPAP follow up . Pt states she has no questions or concerns for this visit   ? ?Objective:  ?Neurological Exam ? ?Physical Exam ?Physical Examination:  ? ?Vitals:  ? 05/01/22 0815  ?BP: 135/90  ?Pulse: 75  ? ? ?General Examination: The patient is a very pleasant 56 y.o. female in no acute distress. She appears well-developed and well-nourished and well groomed.  ? ?HEENT: Normocephalic, atraumatic, pupils are equal, round and reactive to light, extraocular tracking is well preserved. Hearing is grossly intact. Face is symmetric with normal facial animation. Speech is clear with no dysarthria noted. There is no hypophonia. There is no lip, neck/head, jaw or voice tremor. Neck is supple with full range of passive and active motion. There are no carotid bruits on auscultation. Oropharynx exam reveals: no significant mouth dryness, good dental hygiene and moderate airway crowding. Tongue protrudes centrally.  ?  ?Chest: Clear to auscultation without wheezing, rhonchi or  crackles noted. ?  ?Heart: S1+S2+0, regular and normal without murmurs, rubs or gallops noted.  ?  ?Abdomen: Soft, non-tender and non-distended. ?  ?Extremities: There is no pitting edema in the distal lower extrem

## 2022-05-01 NOTE — Patient Instructions (Addendum)
It was nice to see you again today.  You are compliant with your AutoPap and I am glad to hear that things are going well. For your mask, please try the [facility] full face mask we provided as a sample. If you like it, you can order it through [facility].  ? ?At this juncture, please follow routinely to see one of our nurse practitioners in 1 year.  We can offer you a virtual visit through [facility] video call if you prefer.   ? ?Please continue using your autoPAP regularly. While your insurance requires that you use PAP at least 4 hours each night on 70% of the nights, I recommend, that you not skip any nights and use it throughout the night if you can. Getting used to PAP and staying with the treatment long term does take time and patience and discipline. Untreated obstructive sleep apnea when it is moderate to severe can have an adverse impact on cardiovascular health and raise her risk for heart disease, arrhythmias, hypertension, congestive heart failure, stroke and diabetes. Untreated obstructive sleep apnea causes sleep disruption, nonrestorative sleep, and sleep deprivation. This can have an impact on your day to day functioning and cause daytime sleepiness and impairment of cognitive function, memory loss, mood disturbance, and problems focussing. Using PAP regularly can improve these symptoms. ? ? ? ?

## 2022-05-03 ENCOUNTER — Encounter: Payer: Self-pay | Admitting: Internal Medicine

## 2022-05-03 ENCOUNTER — Ambulatory Visit (INDEPENDENT_AMBULATORY_CARE_PROVIDER_SITE_OTHER): Payer: BC Managed Care – PPO | Admitting: Internal Medicine

## 2022-05-03 VITALS — BP 120/70 | HR 68 | Temp 98.1°F | Ht 66.0 in | Wt 175.0 lb

## 2022-05-03 DIAGNOSIS — H6121 Impacted cerumen, right ear: Secondary | ICD-10-CM

## 2022-05-03 DIAGNOSIS — Z6828 Body mass index (BMI) 28.0-28.9, adult: Secondary | ICD-10-CM

## 2022-05-03 DIAGNOSIS — R051 Acute cough: Secondary | ICD-10-CM | POA: Diagnosis not present

## 2022-05-03 DIAGNOSIS — Z0000 Encounter for general adult medical examination without abnormal findings: Secondary | ICD-10-CM

## 2022-05-03 DIAGNOSIS — R7303 Prediabetes: Secondary | ICD-10-CM

## 2022-05-03 MED ORDER — HYDROCODONE BIT-HOMATROP MBR 5-1.5 MG/5ML PO SOLN: 5.0000 mL | Freq: Four times a day (QID) | ORAL | 0 refills | Status: DC | PRN

## 2022-05-03 MED ORDER — MONTELUKAST SODIUM 10 MG PO TABS: 10.0000 mg | ORAL_TABLET | Freq: Every day | ORAL | 1 refills | Status: DC

## 2022-05-03 NOTE — Patient Instructions (Signed)

## 2022-05-03 NOTE — Progress Notes (Signed)
Sheryl [name],acting as a Education administrator for Sheryl Greenland, MD.,have documented all relevant documentation on the behalf of Sheryl Greenland, MD,as directed by  Sheryl Greenland, MD while in the presence of Sheryl Greenland, MD.  This visit occurred during the SARS-CoV-2 public health emergency.  Safety protocols were in place, including screening questions prior to the visit, additional usage of staff PPE, and extensive cleaning of exam room while observing appropriate contact time as indicated for disinfecting solutions.  Subjective:     Patient ID: Sheryl [name] , female    DOB: 1966-03-01 , 56 y.o.   MRN: [id number]   Chief Complaint  Patient presents with   Annual Exam    HPI  She is here today for a full physical exam. She is now followed by Dr. [name] for her GYN exams.  She was last seen May 2022.  Patient does not have any concerns or questions at this time.     Past Medical History:  Diagnosis Date   Dysmenorrhea    Endocervical polyp    Fibroids    GERD (gastroesophageal reflux disease)    diet controlled   H/O varicella    History of chicken pox    History of measles    History of measles, mumps, or rubella    History of mumps    Menorrhagia    Nocturia    Unstable bladder    Urinary frequency    Yeast infection      Family History  Problem Relation Age of Onset   Diverticulitis Mother    Diabetes Father    Diabetes Paternal Grandfather    Sleep apnea Neg Hx      Current Outpatient Medications:    Ascorbic Acid (VITAMIN C PO), Take by mouth., Disp: , Rfl:    benzonatate (TESSALON PERLES) 100 MG capsule, Take 1 capsule (100 mg total) by mouth 3 (three) times daily as needed for cough., Disp: 30 capsule, Rfl: 1   Biotin w/ Vitamins C & E (HAIR/SKIN/NAILS PO), Take by mouth., Disp: , Rfl:    Calcium Carbonate (CALCIUM 600 PO), Take by mouth. 1 daily, Disp: , Rfl:    Cholecalciferol (VITAMIN D3 PO), 5,000 Units daily. , Disp: , Rfl:    clonazePAM  (KLONOPIN) 0.5 MG tablet, Take 1 tablet (0.5 mg total) by mouth 2 (two) times daily. Prn anxiety, Disp: 30 tablet, Rfl: 0   Cyanocobalamin (B-12 PO), Take by mouth., Disp: , Rfl:    EPINEPHrine (EPIPEN 2-PAK) 0.3 mg/0.3 mL IJ SOAJ injection, EpiPen 2-Pak 0.3 mg/0.3 mL injection, auto-injector, Disp: 1 each, Rfl: 2   fluticasone (FLONASE) 50 MCG/ACT nasal spray, 2 sprays via each nostril daily, Disp: 16 g, Rfl: 1   HYDROcodone bit-homatropine (HYDROMET) 5-1.5 MG/5ML syrup, Take 5 mLs by mouth every 6 (six) hours as needed for cough., Disp: 120 mL, Rfl: 0   ibuprofen (ADVIL,MOTRIN) 600 MG tablet, 1 po pc q 6 hours x 5 days then prn-pain, Disp: 30 tablet, Rfl: 1   levocetirizine (XYZAL) 5 MG tablet, Take 1 tablet (5 mg total) by mouth every evening., Disp: 90 tablet, Rfl: 1   MILK THISTLE PO, Take by mouth., Disp: , Rfl:    Multiple Vitamins-Minerals (MULTIVITAMIN ADULTS PO), multivitamin, Disp: , Rfl:    Probiotic Product (RESTORA) CAPS, TAKE ONE CAPSULE BY MOUTH EVERY DAY, Disp: 30 capsule, Rfl: 5   montelukast (SINGULAIR) 10 MG tablet, Take 1 tablet (10 mg total) by mouth daily., Disp: 90 tablet, Rfl:  1   Allergies  Allergen Reactions   Almond (Diagnostic) Anaphylaxis   Cat Hair Extract    Milk-Related Compounds       The patient states she uses post menopausal status for birth control. Last LMP was No LMP recorded. Patient is perimenopausal.. Negative for Dysmenorrhea. Negative for: breast discharge, breast lump(s), breast pain and breast self exam. Associated symptoms include abnormal vaginal bleeding. Pertinent negatives include abnormal bleeding (hematology), anxiety, decreased libido, depression, difficulty falling sleep, dyspareunia, history of infertility, nocturia, sexual dysfunction, sleep disturbances, urinary incontinence, urinary urgency, vaginal discharge and vaginal itching. Diet regular.The patient states her exercise level is    . The patient's tobacco use is:  Social History    Tobacco Use  Smoking Status Never  Smokeless Tobacco Never  . She has been exposed to passive smoke. The patient's alcohol use is:  Social History   Substance and Sexual Activity  Alcohol Use Yes   Comment: 3 glasses of wine montly    Review of Systems  HENT: Negative.    Eyes: Negative.   Respiratory: Negative.    Cardiovascular: Negative.   Gastrointestinal: Negative.   Endocrine: Negative.   Genitourinary: Negative.   Musculoskeletal: Negative.   Skin: Negative.   Allergic/Immunologic: Negative.   Neurological: Negative.   Hematological: Negative.   Psychiatric/Behavioral: Negative.      Today's Vitals   05/03/22 0944  BP: 120/70  Pulse: 68  Temp: 98.1 F (36.7 C)  Weight: 175 lb (79.4 kg)  Height: $Remove'5\' 6"'eBAHslX$  (1.676 m)  PainSc: 0-No pain   Body mass index is 28.25 kg/m.  Wt Readings from Last 3 Encounters:  05/03/22 175 lb (79.4 kg)  05/01/22 177 lb 12.8 oz (80.6 kg)  12/27/21 174 lb 9.6 oz (79.2 kg)     Objective:  Physical Exam Constitutional:      General: She is not in acute distress.    Appearance: Normal appearance. She is well-developed. She is obese.  HENT:     Head: Normocephalic and atraumatic.     Right Ear: Hearing, ear canal and external ear normal. There is impacted cerumen.     Left Ear: Hearing, tympanic membrane, ear canal and external ear normal. There is no impacted cerumen.     Mouth/Throat:     Mouth: Mucous membranes are dry.     Pharynx: Oropharynx is clear.  Eyes:     General: Lids are normal.     Extraocular Movements: Extraocular movements intact.     Conjunctiva/sclera: Conjunctivae normal.     Pupils: Pupils are equal, round, and reactive to light.     Funduscopic exam:    Right eye: No papilledema.        Left eye: No papilledema.  Neck:     Thyroid: No thyroid mass.     Vascular: No carotid bruit.  Cardiovascular:     Rate and Rhythm: Normal rate and regular rhythm.     Pulses: Normal pulses.     Heart sounds:  Normal heart sounds. No murmur heard. Pulmonary:     Effort: Pulmonary effort is normal.     Breath sounds: Normal breath sounds.  Chest:     Chest wall: No mass.  Breasts:    Tanner Score is 5.     Right: Normal. No mass or tenderness.     Left: Normal. No mass or tenderness.  Abdominal:     General: Abdomen is flat. Bowel sounds are normal. There is no distension.  Palpations: Abdomen is soft.     Tenderness: There is no abdominal tenderness.  Genitourinary:    Rectum: Guaiac result negative.  Musculoskeletal:        General: No swelling. Normal range of motion.     Cervical back: Full passive range of motion without pain, normal range of motion and neck supple.     Right lower leg: No edema.     Left lower leg: No edema.  Lymphadenopathy:     Upper Body:     Right upper body: No supraclavicular, axillary or pectoral adenopathy.     Left upper body: No supraclavicular, axillary or pectoral adenopathy.  Skin:    General: Skin is warm and dry.     Capillary Refill: Capillary refill takes less than 2 seconds.  Neurological:     General: No focal deficit present.     Mental Status: She is alert and oriented to person, place, and time.     Cranial Nerves: No cranial nerve deficit.     Sensory: No sensory deficit.  Psychiatric:        Mood and Affect: Mood normal.        Behavior: Behavior normal.        Thought Content: Thought content normal.        Judgment: Judgment normal.     Assessment And Plan:     1. Encounter for general adult medical examination w/o abnormal findings Comments: A full exam was performed. Importance of monthly self breast exams was discussed with the patient. She will continue GYN exams w/ Dr. [name]. PATIENT IS ADVISED TO GET 30-45 MINUTES REGULAR EXERCISE NO LESS THAN FOUR TO FIVE DAYS PER WEEK - BOTH WEIGHTBEARING EXERCISES AND AEROBIC ARE RECOMMENDED.  PATIENT IS ADVISED TO FOLLOW A HEALTHY DIET WITH AT LEAST SIX FRUITS/VEGGIES PER DAY, DECREASE  INTAKE OF RED MEAT, AND TO INCREASE FISH INTAKE TO TWO DAYS PER WEEK.  MEATS/FISH SHOULD NOT BE FRIED, BAKED OR BROILED IS PREFERABLE.  IT IS ALSO IMPORTANT TO CUT BACK ON YOUR SUGAR INTAKE. PLEASE AVOID ANYTHING WITH ADDED SUGAR, CORN SYRUP OR OTHER SWEETENERS. IF YOU MUST USE A SWEETENER, YOU CAN TRY [employer]EV[employer]. IT IS ALSO IMPORTANT TO AVOID ARTIFICIALLY SWEETENERS AND DIET BEVERAGES. LASTLY, I SUGGEST WEARING SPF 50 SUNSCREEN ON EXPOSED PARTS AND ESPECIALLY WHEN IN THE DIRECT SUNLIGHT FOR AN EXTENDED PERIOD OF TIME.  PLEASE AVOID FAST FOOD RESTAURANTS AND INCREASE YOUR WATER INTAKE.  - Lipid panel - BMP8+EGFR - Insulin, random(561)  2. Acute cough Comments: She reports neg COVID testing. Sx are likely due to PND. Will send rx hydromet to use nightly prn. She will let me know if her sx persist.  - HYDROcodone bit-homatropine (HYDROMET) 5-1.5 MG/5ML syrup; Take 5 mLs by mouth every 6 (six) hours as needed for cough.  Dispense: 120 mL; Refill: 0  3. Right ear impacted cerumen AFTER OBTAINING VERBAL CONSENT, RIGHT EAR WAS FLUSHED BY IRRIGATION. SHE TOLERATED PROCEDURE WELL WITHOUT ANY COMPLICATIONS. NO TM ABNORMALITIES WERE NOTED.  - Ear Lavage  4. BMI 28.0-28.9,adult Comments: She is encouraged to aim for at least 150 minutes of exercise per week.   Patient was given opportunity to ask questions. Patient verbalized understanding of the plan and was able to repeat key elements of the plan. All questions were answered to their satisfaction.   I, Sheryl Greenland, MD, have reviewed all documentation for this visit. The documentation on 05/03/22 for the exam, diagnosis, procedures, and orders are all accurate and  complete.   THE PATIENT IS ENCOURAGED TO PRACTICE SOCIAL DISTANCING DUE TO THE COVID-19 PANDEMIC.

## 2022-05-04 LAB — LIPID PANEL
Chol/HDL Ratio: 4.1 ratio (ref 0.0–4.4)
Cholesterol, Total: 227 mg/dL — ABNORMAL HIGH (ref 100–199)
HDL: 55 mg/dL (ref >39)
LDL Chol Calc (NIH): 153 mg/dL — ABNORMAL HIGH (ref 0–99)
Triglycerides: 108 mg/dL (ref 0–149)
VLDL Cholesterol Cal: 19 mg/dL (ref 5–40)

## 2022-05-04 LAB — BMP8+EGFR
BUN/Creatinine Ratio: 10 (ref 9–23)
BUN: 8 mg/dL (ref 6–24)
CO2: 27 mmol/L (ref 20–29)
Calcium: 9.8 mg/dL (ref 8.7–10.2)
Chloride: 104 mmol/L (ref 96–106)
Creatinine, Ser: 0.83 mg/dL (ref 0.57–1.00)
Glucose: 104 mg/dL — ABNORMAL HIGH (ref 70–99)
Potassium: 4.4 mmol/L (ref 3.5–5.2)
Sodium: 146 mmol/L — ABNORMAL HIGH (ref 134–144)
eGFR: 83 mL/min/{1.73_m2} (ref >59)

## 2022-05-04 LAB — INSULIN, RANDOM: INSULIN: 27.2 u[IU]/mL — ABNORMAL HIGH (ref 2.6–24.9)

## 2022-05-05 DIAGNOSIS — R051 Acute cough: Secondary | ICD-10-CM | POA: Insufficient documentation

## 2022-05-05 DIAGNOSIS — Z6828 Body mass index (BMI) 28.0-28.9, adult: Secondary | ICD-10-CM | POA: Insufficient documentation

## 2022-05-05 DIAGNOSIS — H6121 Impacted cerumen, right ear: Secondary | ICD-10-CM | POA: Insufficient documentation

## 2022-06-26 ENCOUNTER — Telehealth: Payer: Self-pay | Admitting: Neurology

## 2022-06-26 NOTE — Telephone Encounter (Signed)
Pt said the trial mask given at last f/u do not fit the CPAP machine. Would like a call from the nurse.

## 2022-06-27 NOTE — Telephone Encounter (Signed)
I called the pt and relayed message directly from Dr Rexene Alberts. The patient will call [facility]. She verbalized appreciation for the call.

## 2022-06-27 NOTE — Telephone Encounter (Signed)
Probably best to take the mask including all connectors and headgear to her DME provider and see if they can find her an adapter or a similar mask.

## 2022-07-24 LAB — HM PAP SMEAR

## 2022-08-29 ENCOUNTER — Other Ambulatory Visit: Payer: Self-pay | Admitting: Internal Medicine

## 2023-04-10 LAB — COMPREHENSIVE METABOLIC PANEL
Albumin: 4.3 (ref 3.5–5.0)
Calcium: 9.8 (ref 8.7–10.7)
Globulin: 2.8

## 2023-04-10 LAB — HEPATIC FUNCTION PANEL
ALT: 136 U/L — AB (ref 7–35)
AST: 74 — AB (ref 13–35)
Alkaline Phosphatase: 230 — AB (ref 25–125)
Bilirubin, Total: 0.6

## 2023-04-10 LAB — CBC AND DIFFERENTIAL
HCT: 43 (ref 36–46)
Hemoglobin: 14.4 (ref 12.0–16.0)
WBC: 7

## 2023-04-10 LAB — BASIC METABOLIC PANEL
BUN: 15 (ref 4–21)
Chloride: 103 (ref 99–108)
Glucose: 108
Potassium: 4.2 mEq/L (ref 3.5–5.1)
Sodium: 142 (ref 137–147)

## 2023-04-10 LAB — LIPID PANEL
Cholesterol: 219 — AB (ref 0–200)
HDL: 67 (ref 35–70)
LDL Cholesterol: 14
Triglycerides: 79 (ref 40–160)

## 2023-04-10 LAB — CBC: RBC: 4.62 (ref 3.87–5.11)

## 2023-04-10 LAB — TSH: TSH: 1.42 (ref 0.41–5.90)

## 2023-04-12 ENCOUNTER — Other Ambulatory Visit: Payer: Self-pay | Admitting: Internal Medicine

## 2023-05-02 ENCOUNTER — Ambulatory Visit: Payer: Self-pay | Admitting: Neurology

## 2023-05-08 ENCOUNTER — Encounter: Payer: Self-pay | Admitting: Internal Medicine

## 2023-05-08 ENCOUNTER — Ambulatory Visit: Payer: No Typology Code available for payment source | Admitting: Internal Medicine

## 2023-05-08 VITALS — BP 118/84 | HR 78 | Temp 97.9°F | Ht 66.0 in | Wt 175.6 lb

## 2023-05-08 DIAGNOSIS — Z0000 Encounter for general adult medical examination without abnormal findings: Secondary | ICD-10-CM

## 2023-05-08 DIAGNOSIS — R7309 Other abnormal glucose: Secondary | ICD-10-CM | POA: Diagnosis not present

## 2023-05-08 DIAGNOSIS — L989 Disorder of the skin and subcutaneous tissue, unspecified: Secondary | ICD-10-CM | POA: Diagnosis not present

## 2023-05-08 DIAGNOSIS — H6121 Impacted cerumen, right ear: Secondary | ICD-10-CM

## 2023-05-08 DIAGNOSIS — Z2821 Immunization not carried out because of patient refusal: Secondary | ICD-10-CM

## 2023-05-08 DIAGNOSIS — Z6828 Body mass index (BMI) 28.0-28.9, adult: Secondary | ICD-10-CM

## 2023-05-08 MED ORDER — LEVOCETIRIZINE DIHYDROCHLORIDE 5 MG PO TABS: 5.0000 mg | ORAL_TABLET | Freq: Every evening | ORAL | 1 refills | Status: DC

## 2023-05-08 MED ORDER — RESTORA PO CAPS: ORAL_CAPSULE | ORAL | 5 refills | Status: AC

## 2023-05-08 MED ORDER — FLUTICASONE PROPIONATE 50 MCG/ACT NA SUSP: NASAL | 2 refills | Status: AC

## 2023-05-08 NOTE — Progress Notes (Signed)
I,Victoria T Hamilton,acting as a scribe for Gwynneth Aliment, MD.,have documented all relevant documentation on the behalf of Gwynneth Aliment, MD,as directed by  Gwynneth Aliment, MD while in the presence of Gwynneth Aliment, MD.   Subjective:     Patient ID: Sheryl [name] , female    DOB: 06-17-1966 , 57 y.o.   MRN: [id number]   Chief Complaint  Patient presents with   Annual Exam   Prediabetes    HPI  She is here today for a full physical exam. She is followed by Dr. Normand Sloop for her GYN exams. Patient does not have any concerns or questions at this time. She will call GYN office to sch appointment for Mammo. Since her last visit, she has moved to [place]. She likes her new neighborhood and loves being closer to her family.       Past Medical History:  Diagnosis Date   Dysmenorrhea    Endocervical polyp    Fibroids    GERD (gastroesophageal reflux disease)    diet controlled   H/O varicella    History of chicken pox    History of measles    History of measles, mumps, or rubella    History of mumps    Menorrhagia    Nocturia    Unstable bladder    Urinary frequency    Yeast infection      Family History  Problem Relation Age of Onset   Diverticulitis Mother    Diabetes Father    Diabetes Paternal Grandfather    Sleep apnea Neg Hx      Current Outpatient Medications:    Ascorbic Acid (VITAMIN C PO), Take by mouth., Disp: , Rfl:    Biotin w/ Vitamins C & E (HAIR/SKIN/NAILS PO), Take by mouth., Disp: , Rfl:    Calcium Carbonate (CALCIUM 600 PO), Take by mouth. 1 daily, Disp: , Rfl:    Cholecalciferol (VITAMIN D3 PO), 5,000 Units daily. , Disp: , Rfl:    Cyanocobalamin (B-12 PO), Take by mouth., Disp: , Rfl:    EPINEPHrine (EPIPEN 2-PAK) 0.3 mg/0.3 mL IJ SOAJ injection, EpiPen 2-Pak 0.3 mg/0.3 mL injection, auto-injector, Disp: 1 each, Rfl: 2   HYDROcodone bit-homatropine (HYDROMET) 5-1.5 MG/5ML syrup, Take 5 mLs by mouth every 6 (six) hours as needed for cough.,  Disp: 120 mL, Rfl: 0   ibuprofen (ADVIL,MOTRIN) 600 MG tablet, 1 po pc q 6 hours x 5 days then prn-pain, Disp: 30 tablet, Rfl: 1   MILK THISTLE PO, Take by mouth., Disp: , Rfl:    montelukast (SINGULAIR) 10 MG tablet, TAKE 1 TABLET BY MOUTH EVERY DAY, Disp: 90 tablet, Rfl: 1   Multiple Vitamins-Minerals (MULTIVITAMIN ADULTS PO), multivitamin, Disp: , Rfl:    clonazePAM (KLONOPIN) 0.5 MG tablet, Take 1 tablet (0.5 mg total) by mouth 2 (two) times daily. Prn anxiety, Disp: 30 tablet, Rfl: 0   fluticasone (FLONASE) 50 MCG/ACT nasal spray, 2 sprays via each nostril daily, Disp: 48 g, Rfl: 2   levocetirizine (XYZAL) 5 MG tablet, Take 1 tablet (5 mg total) by mouth every evening., Disp: 90 tablet, Rfl: 1   Probiotic Product (RESTORA) CAPS, TAKE 1 CAPSULE BY MOUTH EVERY DAY, Disp: 30 capsule, Rfl: 5   Allergies  Allergen Reactions   Almond (Diagnostic) Anaphylaxis   Cat Hair Extract    Milk-Related Compounds       The patient states she uses post menopausal status for birth control. Last LMP was Patient's last menstrual period was  04/15/2018 (approximate).. Negative for Dysmenorrhea. Negative for: breast discharge, breast lump(s), breast pain and breast self exam. Associated symptoms include abnormal vaginal bleeding. Pertinent negatives include abnormal bleeding (hematology), anxiety, decreased libido, depression, difficulty falling sleep, dyspareunia, history of infertility, nocturia, sexual dysfunction, sleep disturbances, urinary incontinence, urinary urgency, vaginal discharge and vaginal itching. Diet regular . The patient's tobacco use is:  Social History   Tobacco Use  Smoking Status Never  Smokeless Tobacco Never  . She has been exposed to passive smoke. The patient's alcohol use is:  Social History   Substance and Sexual Activity  Alcohol Use Yes   Comment: 3 glasses of wine montly    Review of Systems  Constitutional: Negative.   HENT: Negative.    Eyes: Negative.    Respiratory: Negative.    Cardiovascular: Negative.   Gastrointestinal: Negative.   Endocrine: Negative.   Genitourinary: Negative.   Musculoskeletal: Negative.   Skin: Negative.   Allergic/Immunologic: Negative.   Neurological: Negative.   Hematological: Negative.   Psychiatric/Behavioral: Negative.       Today's Vitals   05/08/23 1114  BP: 118/84  Pulse: 78  Temp: 97.9 F (36.6 C)  SpO2: 98%  Weight: 175 lb 9.6 oz (79.7 kg)  Height: 5\' 6"  (1.676 m)   Body mass index is 28.34 kg/m.  Wt Readings from Last 3 Encounters:  05/08/23 175 lb 9.6 oz (79.7 kg)  05/03/22 175 lb (79.4 kg)  05/01/22 177 lb 12.8 oz (80.6 kg)    Objective:  Physical Exam Vitals and nursing note reviewed.  Constitutional:      General: She is not in acute distress.    Appearance: Normal appearance. She is well-developed. She is obese.  HENT:     Head: Normocephalic and atraumatic.     Right Ear: Hearing, ear canal and external ear normal. There is impacted cerumen.     Left Ear: Hearing, tympanic membrane, ear canal and external ear normal. There is no impacted cerumen.     Nose: Nose normal.     Mouth/Throat:     Mouth: Mucous membranes are moist.     Pharynx: Oropharynx is clear.  Eyes:     General: Lids are normal.     Extraocular Movements: Extraocular movements intact.     Conjunctiva/sclera: Conjunctivae normal.     Pupils: Pupils are equal, round, and reactive to light.     Funduscopic exam:    Right eye: No papilledema.        Left eye: No papilledema.  Neck:     Thyroid: No thyroid mass.     Vascular: No carotid bruit.  Cardiovascular:     Rate and Rhythm: Normal rate and regular rhythm.     Pulses: Normal pulses.     Heart sounds: Normal heart sounds. No murmur heard. Pulmonary:     Effort: Pulmonary effort is normal.     Breath sounds: Normal breath sounds.  Chest:     Chest wall: No mass.  Breasts:    Tanner Score is 5.     Right: Normal. No mass or tenderness.      Left: Normal. No mass or tenderness.  Abdominal:     General: Abdomen is flat. Bowel sounds are normal. There is no distension.     Palpations: Abdomen is soft.     Tenderness: There is no abdominal tenderness.  Genitourinary:    Rectum: Guaiac result negative.     Comments: deferred Musculoskeletal:  General: No swelling. Normal range of motion.     Cervical back: Full passive range of motion without pain, normal range of motion and neck supple.     Right lower leg: No edema.     Left lower leg: No edema.  Lymphadenopathy:     Upper Body:     Right upper body: No supraclavicular, axillary or pectoral adenopathy.     Left upper body: No supraclavicular, axillary or pectoral adenopathy.  Skin:    General: Skin is warm and dry.     Capillary Refill: Capillary refill takes less than 2 seconds.     Comments: Hyperpigmented, scaly stuck on plaque, located RLQ  Neurological:     General: No focal deficit present.     Mental Status: She is alert and oriented to person, place, and time.     Cranial Nerves: No cranial nerve deficit.     Sensory: No sensory deficit.  Psychiatric:        Mood and Affect: Mood normal.        Behavior: Behavior normal.        Thought Content: Thought content normal.        Judgment: Judgment normal.         Assessment And Plan:     1. Encounter for general adult medical examination w/o abnormal findings Comments: A full exam was performed. Importance of monthly self breast exams was discussed with the patient.  She brought in biometric labs from work, they will be abstracted into her Mychart. Results were reviewed in detail during her visit. PATIENT IS ADVISED TO GET 30-45 MINUTES REGULAR EXERCISE NO LESS THAN FOUR TO FIVE DAYS PER WEEK - BOTH WEIGHTBEARING EXERCISES AND AEROBIC ARE RECOMMENDED.  PATIENT IS ADVISED TO FOLLOW A HEALTHY DIET WITH AT LEAST SIX FRUITS/VEGGIES PER DAY, DECREASE INTAKE OF RED MEAT, AND TO INCREASE FISH INTAKE TO TWO DAYS PER  WEEK.  MEATS/FISH SHOULD NOT BE FRIED, BAKED OR BROILED IS PREFERABLE.  IT IS ALSO IMPORTANT TO CUT BACK ON YOUR SUGAR INTAKE. PLEASE AVOID ANYTHING WITH ADDED SUGAR, CORN SYRUP OR OTHER SWEETENERS. IF YOU MUST USE A SWEETENER, YOU CAN TRY STEVIA. IT IS ALSO IMPORTANT TO AVOID ARTIFICIALLY SWEETENERS AND DIET BEVERAGES. LASTLY, I SUGGEST WEARING SPF 50 SUNSCREEN ON EXPOSED PARTS AND ESPECIALLY WHEN IN THE DIRECT SUNLIGHT FOR AN EXTENDED PERIOD OF TIME.  PLEASE AVOID FAST FOOD RESTAURANTS AND INCREASE YOUR WATER INTAKE.  2. Abnormal glucose Comments: Previous labs reviewed, her a1c has been elevated in the past. I will recheck this today, reminded to limit intake of sugary beverages/foods. - Hemoglobin A1c  3. Skin lesion Comments: Suggestive of seborrheic keratosis, she would like to see Derm for f/u. She will get back to me with name of preferred provider(in [facility]).  4. Right ear impacted cerumen Comments: After obtaining verbal consent, Right ear was flushed by irrigation w/o complications. No TM abnormality was noted. - Ear Lavage  5. BMI 28.0-28.9,adult Comments: She is encouraged to aim for at least 150 minutes of exercise/week.  6. Herpes zoster vaccination declined    Return in 6 months (on 11/08/2023), or glucose check, for 1 year physical.  Patient was given opportunity to ask questions. Patient verbalized understanding of the plan and was able to repeat key elements of the plan. All questions were answered to their satisfaction.   I, Gwynneth Aliment, MD, have reviewed all documentation for this visit. The documentation on 05/08/23 for the exam, diagnosis, procedures, and orders  are all accurate and complete.   THE PATIENT IS ENCOURAGED TO PRACTICE SOCIAL DISTANCING DUE TO THE COVID-19 PANDEMIC.

## 2023-05-08 NOTE — Patient Instructions (Addendum)
Seborrheic Keratosis A seborrheic keratosis is a common, noncancerous (benign) skin growth. These growths are velvety, waxy, or rough spots that appear on the skin. They are often tan, brown, or black. The skin growths can be flat or raised and may be scaly. What are the causes? The cause of this condition is not known. What increases the risk? You are more likely to develop this condition if you: Have a family history of seborrheic keratosis. Are 63 years old or older. Are pregnant. Have had estrogen replacement therapy. What are the signs or symptoms? Symptoms of this condition include growths on the face, chest, shoulders, back, or other areas. These growths: Are usually painless, but may become irritated and itchy. Can be tan, yellow, brown, black, or other colors. Are slightly raised or have a flat surface. Are sometimes rough or wart-like in texture. Are often velvety or waxy on the surface. Are round or oval-shaped. Often occur in groups, but may occur as a single growth. How is this diagnosed? This condition is diagnosed with a medical history and physical exam. A sample of the growth may be tested (skin biopsy). You may also need to see a skin specialist (dermatologist). How is this treated? Treatment is not usually needed for this condition unless the growths are irritated or bleed often. You may also choose to have the growths removed if you do not like their appearance. Growth removal may include a procedure in which: Liquid nitrogen is applied to "freeze" off the growth (cryosurgery). This is the most common procedure. The growth is burned off with electricity (electrocautery). The growth is removed by scraping (curettage). Follow these instructions at home: Watch your growth or growths for any changes. Do not scratch or pick at the growth or growths. This can cause them to become irritated or infected. Contact a health care provider if: You suddenly have many new  growths. Your growth bleeds, itches, or hurts. Your growth suddenly becomes larger or changes color. Summary A seborrheic keratosis is a common, noncancerous skin growth. Treatment is not usually needed for this condition unless the growths are irritated or bleed often. Watch your growth or growths for any changes. Contact a health care provider if you suddenly have many new growths or your growth suddenly becomes larger or changes color. This information is not intended to replace advice given to you by your health care provider. Make sure you discuss any questions you have with your health care provider. Document Revised: 02/16/2022 Document Reviewed: 02/16/2022 Elsevier Patient Education  2023 Elsevier Inc.   Health Maintenance, Female Adopting a healthy lifestyle and getting preventive care are important in promoting health and wellness. Ask your health care provider about: The right schedule for you to have regular tests and exams. Things you can do on your own to prevent diseases and keep yourself healthy. What should I know about diet, weight, and exercise? Eat a healthy diet  Eat a diet that includes plenty of vegetables, fruits, low-fat dairy products, and lean protein. Do not eat a lot of foods that are high in solid fats, added sugars, or sodium. Maintain a healthy weight Body mass index (BMI) is used to identify weight problems. It estimates body fat based on height and weight. Your health care provider can help determine your BMI and help you achieve or maintain a healthy weight. Get regular exercise Get regular exercise. This is one of the most important things you can do for your health. Most adults should: Exercise for at least  150 minutes each week. The exercise should increase your heart rate and make you sweat (moderate-intensity exercise). Do strengthening exercises at least twice a week. This is in addition to the moderate-intensity exercise. Spend less time sitting.  Even light physical activity can be beneficial. Watch cholesterol and blood lipids Have your blood tested for lipids and cholesterol at 57 years of age, then have this test every 5 years. Have your cholesterol levels checked more often if: Your lipid or cholesterol levels are high. You are older than 57 years of age. You are at high risk for heart disease. What should I know about cancer screening? Depending on your health history and family history, you may need to have cancer screening at various ages. This may include screening for: Breast cancer. Cervical cancer. Colorectal cancer. Skin cancer. Lung cancer. What should I know about heart disease, diabetes, and high blood pressure? Blood pressure and heart disease High blood pressure causes heart disease and increases the risk of stroke. This is more likely to develop in people who have high blood pressure readings or are overweight. Have your blood pressure checked: Every 3-5 years if you are 53-56 years of age. Every year if you are 36 years old or older. Diabetes Have regular diabetes screenings. This checks your fasting blood sugar level. Have the screening done: Once every three years after age 32 if you are at a normal weight and have a low risk for diabetes. More often and at a younger age if you are overweight or have a high risk for diabetes. What should I know about preventing infection? Hepatitis B If you have a higher risk for hepatitis B, you should be screened for this virus. Talk with your health care provider to find out if you are at risk for hepatitis B infection. Hepatitis C Testing is recommended for: Everyone born from [birth year] through 1965. Anyone with known risk factors for hepatitis C. Sexually transmitted infections (STIs) Get screened for STIs, including gonorrhea and chlamydia, if: You are sexually active and are younger than 57 years of age. You are older than 57 years of age and your health care provider  tells you that you are at risk for this type of infection. Your sexual activity has changed since you were last screened, and you are at increased risk for chlamydia or gonorrhea. Ask your health care provider if you are at risk. Ask your health care provider about whether you are at high risk for HIV. Your health care provider may recommend a prescription medicine to help prevent HIV infection. If you choose to take medicine to prevent HIV, you should first get tested for HIV. You should then be tested every 3 months for as long as you are taking the medicine. Pregnancy If you are about to stop having your period (premenopausal) and you may become pregnant, seek counseling before you get pregnant. Take 400 to 800 micrograms (mcg) of folic acid every day if you become pregnant. Ask for birth control (contraception) if you want to prevent pregnancy. Osteoporosis and menopause Osteoporosis is a disease in which the bones lose minerals and strength with aging. This can result in bone fractures. If you are 70 years old or older, or if you are at risk for osteoporosis and fractures, ask your health care provider if you should: Be screened for bone loss. Take a calcium or vitamin D supplement to lower your risk of fractures. Be given hormone replacement therapy (HRT) to treat symptoms of menopause. Follow these  instructions at home: Alcohol use Do not drink alcohol if: Your health care provider tells you not to drink. You are pregnant, may be pregnant, or are planning to become pregnant. If you drink alcohol: Limit how much you have to: 0-1 drink a day. Know how much alcohol is in your drink. In the [place] one drink equals one 12 oz bottle of beer (355 mL), one 5 oz glass of wine (148 mL), or one 1 oz glass of hard liquor (44 mL). Lifestyle Do not use any products that contain nicotine or tobacco. These products include cigarettes, chewing tobacco, and vaping devices, such as e-cigarettes. If you  need help quitting, ask your health care provider. Do not use street drugs. Do not share needles. Ask your health care provider for help if you need support or information about quitting drugs. General instructions Schedule regular health, dental, and eye exams. Stay current with your vaccines. Tell your health care provider if: You often feel depressed. You have ever been abused or do not feel safe at home. Summary Adopting a healthy lifestyle and getting preventive care are important in promoting health and wellness. Follow your health care provider's instructions about healthy diet, exercising, and getting tested or screened for diseases. Follow your health care provider's instructions on monitoring your cholesterol and blood pressure. This information is not intended to replace advice given to you by your health care provider. Make sure you discuss any questions you have with your health care provider. Document Revised: 04/24/2021 Document Reviewed: 04/24/2021 Elsevier Patient Education  2023 [employer].

## 2023-05-09 ENCOUNTER — Telehealth: Payer: Self-pay | Admitting: Neurology

## 2023-05-09 DIAGNOSIS — G4733 Obstructive sleep apnea (adult) (pediatric): Secondary | ICD-10-CM

## 2023-05-09 LAB — HEMOGLOBIN A1C
Est. average glucose Bld gHb Est-mCnc: 123 mg/dL
Hgb A1c MFr Bld: 5.9 % — ABNORMAL HIGH (ref 4.8–5.6)

## 2023-05-09 NOTE — Telephone Encounter (Signed)
Patient still using the test mask given to her and she is trying to get supplies from [facility] but [facility] needs the brand name in order to send. She was told to google for the information but she hasn't been successful.

## 2023-05-09 NOTE — Telephone Encounter (Signed)
Per May 2023 office note:

## 2023-05-09 NOTE — Telephone Encounter (Signed)
I called [facility] and spoke with a representative. I was told the last note on the patient's file was from back in Jan/Feb of this year.  They were trying to help the patient order supplies and they tried to call her multiple times to discuss. She never returned her call so they closed out the supply order.  I told the representative that patient was given an Evora FFM mask from [facility] in the office visit 04/30/22 but I also noted that the patient had called [place] back in July 2023 stating the mask she was given did not fit her machine.  I was told they may need a new order specifying which mask is requested.  I tried to call the patient so I could find out exactly which mask she is currently using.  Was at the very first mask she received from [facility] at setup or is she now using the [facility] given in office? We may be able to provide order for another mask until patient is seen in September. He was also

## 2023-05-14 NOTE — Addendum Note (Signed)
Addended by: Huston Foley on: 05/14/2023 11:55 AM   Modules accepted: Orders

## 2023-05-14 NOTE — Addendum Note (Signed)
Addended by: Bertram Savin on: 05/14/2023 11:47 AM   Modules accepted: Orders

## 2023-05-14 NOTE — Telephone Encounter (Signed)
Pt returned my call.  She states that she went to [facility] previously and they were able to help her get the sample mask to fit her machine. She said she wasn't doing something right but she has continued to tolerate the mask given in office. The original mask she was given (small FFM 3B [place]) was hurting her nose. I advised I would see if Dr Frances Furbish would provide an order for patient to get a new Evora FFM from [facility] until she is seen for her next follow-up in September.

## 2023-05-14 NOTE — Telephone Encounter (Signed)
Order sent to [facility].  

## 2023-05-14 NOTE — Telephone Encounter (Signed)
Order signed for PAP supplies.

## 2023-05-14 NOTE — Telephone Encounter (Addendum)
I called the patient and LVM (ok per DPR) trying to reach pt a second time to discuss her request regarding CPAP mask.  I asked the patient to give [place] a call back and let us know exactly which mask it is that she is using.  Was is the mask she was given in the office last May or was this the very first mask she was given with her machine?  I let the patient know that [facility] said they were unable to get back in touch with her after they spoke with the patient earlier this year.  I left our office number in the message and asked for a call back.

## 2023-05-14 NOTE — Telephone Encounter (Signed)
[facility] confirmed receipt of order.

## 2023-05-15 ENCOUNTER — Other Ambulatory Visit: Payer: Self-pay | Admitting: Nurse Practitioner

## 2023-05-15 DIAGNOSIS — R748 Abnormal levels of other serum enzymes: Secondary | ICD-10-CM

## 2023-05-15 DIAGNOSIS — K7581 Nonalcoholic steatohepatitis (NASH): Secondary | ICD-10-CM

## 2023-05-21 ENCOUNTER — Other Ambulatory Visit: Payer: BC Managed Care – PPO

## 2023-05-27 ENCOUNTER — Ambulatory Visit
Admission: RE | Admit: 2023-05-27 | Discharge: 2023-05-27 | Disposition: A | Discharge status: Home / Self Care | Payer: No Typology Code available for payment source | Source: Ambulatory Visit | Attending: Nurse Practitioner

## 2023-05-27 DIAGNOSIS — K7581 Nonalcoholic steatohepatitis (NASH): Secondary | ICD-10-CM

## 2023-05-27 DIAGNOSIS — R748 Abnormal levels of other serum enzymes: Secondary | ICD-10-CM

## 2023-07-01 ENCOUNTER — Encounter: Payer: Self-pay | Admitting: Internal Medicine

## 2023-07-01 ENCOUNTER — Telehealth: Payer: No Typology Code available for payment source | Admitting: Internal Medicine

## 2023-07-01 DIAGNOSIS — U071 COVID-19: Secondary | ICD-10-CM | POA: Diagnosis not present

## 2023-07-01 MED ORDER — NIRMATRELVIR/RITONAVIR (PAXLOVID)TABLET: 3.0000 | ORAL_TABLET | Freq: Two times a day (BID) | ORAL | 0 refills | Status: AC

## 2023-07-01 MED ORDER — BENZONATATE 100 MG PO CAPS: 100.0000 mg | ORAL_CAPSULE | Freq: Three times a day (TID) | ORAL | 1 refills | Status: DC | PRN

## 2023-07-01 NOTE — Progress Notes (Signed)
0   Virtual Visit via Video   This visit type was conducted due to national recommendations for restrictions regarding the COVID-19 Pandemic (e.g. social distancing) in an effort to limit this patient's exposure and mitigate transmission in our community.  Due to her co-morbid illnesses, this patient is at least at moderate risk for complications without adequate follow up.  This format is felt to be most appropriate for this patient at this time.  All issues noted in this document were discussed and addressed.  A limited physical exam was performed with this format.    This visit type was conducted due to national recommendations for restrictions regarding the COVID-19 Pandemic (e.g. social distancing) in an effort to limit this patient's exposure and mitigate transmission in our community.  Patients identity confirmed using two different identifiers.  This format is felt to be most appropriate for this patient at this time.  All issues noted in this document were discussed and addressed.  No physical exam was performed (except for noted visual exam findings with Video Visits).    Date:  07/10/2023   ID:  Sheryl [name], DOB 10/23/1966, MRN [id number]  Patient Location:  Home  Provider location:   Office    Chief Complaint:  "I have COVID"  History of Present Illness:    Sheryl [name] is a 57 y.o. female who presents via video conferencing for a telehealth visit today.    The patient does have symptoms concerning for COVID-19 infection (fever, chills, cough, or new shortness of breath).   She presents today for virtual visit. She tested positive for COVID on Saturday.  She recently returned from a women's conference last Thursday.   Saturday, she felt extremely fatigued. Later that day, she started to have cough, sore throat, headache, chills, and achiness. Patient reports she tested positive on Saturday. Patient denies chest pains or SOB. She would like to start antiviral  treatment.     Past Medical History:  Diagnosis Date   Dysmenorrhea    Endocervical polyp    Fibroids    GERD (gastroesophageal reflux disease)    diet controlled   H/O varicella    History of chicken pox    History of measles    History of measles, mumps, or rubella    History of mumps    Menorrhagia    Nocturia    Unstable bladder    Urinary frequency    Yeast infection    Past Surgical History:  Procedure Laterality Date   MYOMECTOMY N/A 10/28/2013   Procedure: MYOMECTOMY;  Surgeon: Kirkland Hun, MD;  Location: [facility];  Service: Gynecology;  Laterality: N/A;     Current Meds  Medication Sig   Ascorbic Acid (VITAMIN C PO) Take by mouth.   benzonatate (TESSALON PERLES) 100 MG capsule Take 1 capsule (100 mg total) by mouth 3 (three) times daily as needed for cough.   Biotin w/ Vitamins C & E (HAIR/SKIN/NAILS PO) Take by mouth.   Calcium Carbonate (CALCIUM 600 PO) Take by mouth. 1 daily   Cholecalciferol (VITAMIN D3 PO) 5,000 Units daily.    Cyanocobalamin (B-12 PO) Take by mouth.   EPINEPHrine (EPIPEN 2-PAK) 0.3 mg/0.3 mL IJ SOAJ injection EpiPen 2-Pak 0.3 mg/0.3 mL injection, auto-injector   fluticasone (FLONASE) 50 MCG/ACT nasal spray 2 sprays via each nostril daily   HYDROcodone bit-homatropine (HYDROMET) 5-1.5 MG/5ML syrup Take 5 mLs by mouth every 6 (six) hours as needed for cough.   ibuprofen (ADVIL,MOTRIN) 600 MG tablet 1 po  pc q 6 hours x 5 days then prn-pain   levocetirizine (XYZAL) 5 MG tablet Take 1 tablet (5 mg total) by mouth every evening.   MILK THISTLE PO Take by mouth.   montelukast (SINGULAIR) 10 MG tablet TAKE 1 TABLET BY MOUTH EVERY DAY   Multiple Vitamins-Minerals (MULTIVITAMIN ADULTS PO) multivitamin   [EXPIRED] nirmatrelvir/ritonavir (PAXLOVID) 20 x 150 MG & 10 x 100MG  TABS Take 3 tablets by mouth 2 (two) times daily for 5 days.   Probiotic Product (RESTORA) CAPS TAKE 1 CAPSULE BY MOUTH EVERY DAY     Allergies:   Almond (diagnostic), Cat hair  extract, and Milk-related compounds   Social History   Tobacco Use   Smoking status: Never   Smokeless tobacco: Never  Vaping Use   Vaping status: Never Used  Substance Use Topics   Alcohol use: Yes    Comment: 3 glasses of wine montly   Drug use: No     Family Hx: The patient's family history includes Diabetes in her father and paternal grandfather; Diverticulitis in her mother. There is no history of Sleep apnea.  ROS:   Please see the history of present illness.    Review of Systems  Constitutional:  Positive for chills and malaise/fatigue.  HENT:  Positive for sore throat.   Respiratory:  Positive for cough.   Neurological:  Positive for headaches.    All other systems reviewed and are negative.   Labs/Other Tests and Data Reviewed:    Recent Labs: 04/10/2023: ALT 136; BUN 15; Hemoglobin 14.4; Potassium 4.2; Sodium 142; TSH 1.42   Recent Lipid Panel Lab Results  Component Value Date/Time   CHOL 219 (A) 04/10/2023 12:00 AM   CHOL 227 (H) 05/03/2022 10:41 AM   TRIG 79 04/10/2023 12:00 AM   HDL 67 04/10/2023 12:00 AM   HDL 55 05/03/2022 10:41 AM   CHOLHDL 4.1 05/03/2022 10:41 AM   LDLCALC 14 04/10/2023 12:00 AM   LDLCALC 153 (H) 05/03/2022 10:41 AM    Wt Readings from Last 3 Encounters:  05/08/23 175 lb 9.6 oz (79.7 kg)  05/03/22 175 lb (79.4 kg)  05/01/22 177 lb 12.8 oz (80.6 kg)     Exam:    Vital Signs:  LMP 04/15/2018 (Approximate)     Physical Exam Vitals and nursing note reviewed.  Constitutional:      Appearance: She is ill-appearing.  HENT:     Head: Normocephalic and atraumatic.  Eyes:     Extraocular Movements: Extraocular movements intact.  Pulmonary:     Effort: Pulmonary effort is normal.  Musculoskeletal:     Cervical back: Normal range of motion.  Neurological:     Mental Status: She is alert and oriented to person, place, and time.  Psychiatric:        Mood and Affect: Affect normal.     ASSESSMENT & PLAN:    Positive  self-administered antigen test for COVID-19 Assessment & Plan:  She would like treatment, rx paxlovid sent to her pharmacy. Advised to take full course, possible side effects d/w patient. I will also refer her for home monitoring/temperature monitoring program. She is encouraged to email me daily on Mychart to let me know how she is doing. She is encouraged to go to ER should she develop worsening SOB. Pt advised that she will be out of work for five days, although she may return sooner if afebrile greater than 24 hours, she should mask for 10 days. She is also advised to stay well  hydrated, move periodically throughout the day and to have a hot beverage daily.  She verbalizes understanding of her treatment plan. All questions were answered to her satisfaction. She understands that she needs to continue to self quarantine     Other orders -     Benzonatate; Take 1 capsule (100 mg total) by mouth 3 (three) times daily as needed for cough.  Dispense: 30 capsule; Refill: 1 -     nirmatrelvir/ritonavir; Take 3 tablets by mouth 2 (two) times daily for 5 days.  Dispense: 30 tablet; Refill: 0     COVID-19 Education: The signs and symptoms of COVID-19 were discussed with the patient and how to seek care for testing (follow up with PCP or arrange E-visit).  The importance of social distancing was discussed today.  Patient Risk:   After full review of this patients clinical status, I feel that they are at least moderate risk at this time.  Time:   Today, I have spent 12 minutes/ seconds with the patient with telehealth technology discussing above diagnoses.     Medication Adjustments/Labs and Tests Ordered: Current medicines are reviewed at length with the patient today.  Concerns regarding medicines are outlined above.   Tests Ordered: No orders of the defined types were placed in this encounter.   Medication Changes: Meds ordered this encounter  Medications   benzonatate (TESSALON PERLES) 100  MG capsule    Sig: Take 1 capsule (100 mg total) by mouth 3 (three) times daily as needed for cough.    Dispense:  30 capsule    Refill:  1   nirmatrelvir/ritonavir (PAXLOVID) 20 x 150 MG & 10 x 100MG  TABS    Sig: Take 3 tablets by mouth 2 (two) times daily for 5 days.    Dispense:  30 tablet    Refill:  0    Disposition:  Follow up prn  Signed, Gwynneth Aliment, MD

## 2023-07-10 DIAGNOSIS — U071 COVID-19: Secondary | ICD-10-CM | POA: Insufficient documentation

## 2023-07-10 NOTE — Assessment & Plan Note (Signed)
She would like treatment, rx paxlovid sent to her pharmacy. Advised to take full course, possible side effects d/w patient. I will also refer her for home monitoring/temperature monitoring program. She is encouraged to email me daily on Mychart to let me know how she is doing. She is encouraged to go to ER should she develop worsening SOB. Pt advised that she will be out of work for five days, although she may return sooner if afebrile greater than 24 hours, she should mask for 10 days. She is also advised to stay well hydrated, move periodically throughout the day and to have a hot beverage daily.  She verbalizes understanding of her treatment plan. All questions were answered to her satisfaction. She understands that she needs to continue to self quarantine

## 2023-07-17 ENCOUNTER — Ambulatory Visit: Payer: Self-pay | Admitting: Neurology

## 2023-07-17 IMAGING — US US ABDOMEN COMPLETE
1 series · 14 of 25 positions shown · non-contrast
Comparison: None.

CLINICAL DATA: Abdominal pain x2 months.

EXAM:
ABDOMEN ULTRASOUND COMPLETE

[Series 1: us abdomen complete · 0.25mm/px · 14 of 86 slices shown]
[im 1/86]
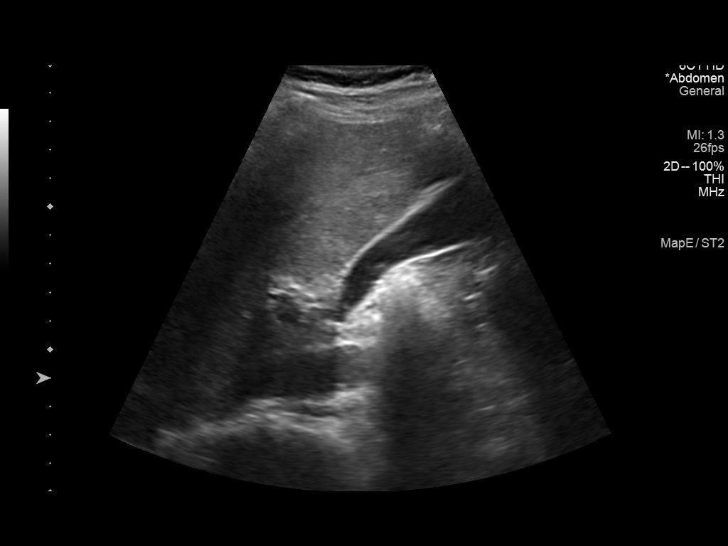
[im 8/86]
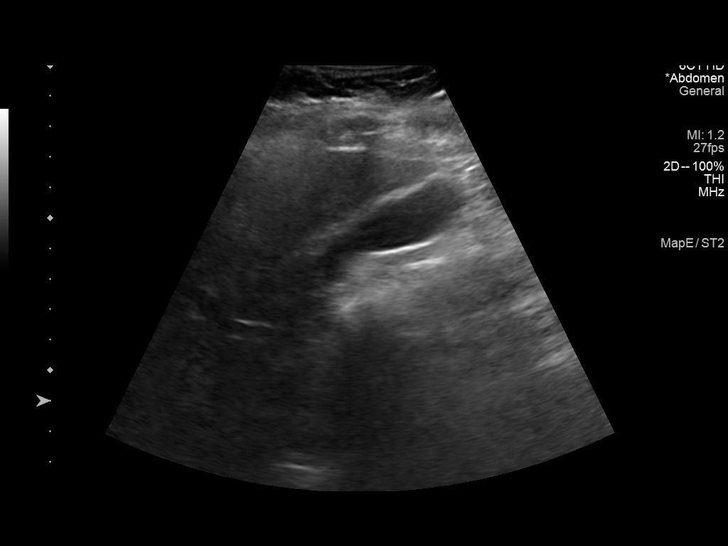
[im 15/86]
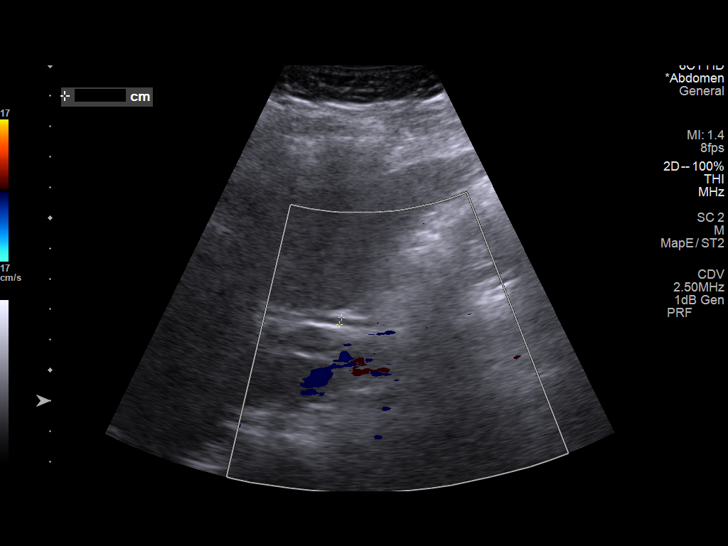
[im 22/86]
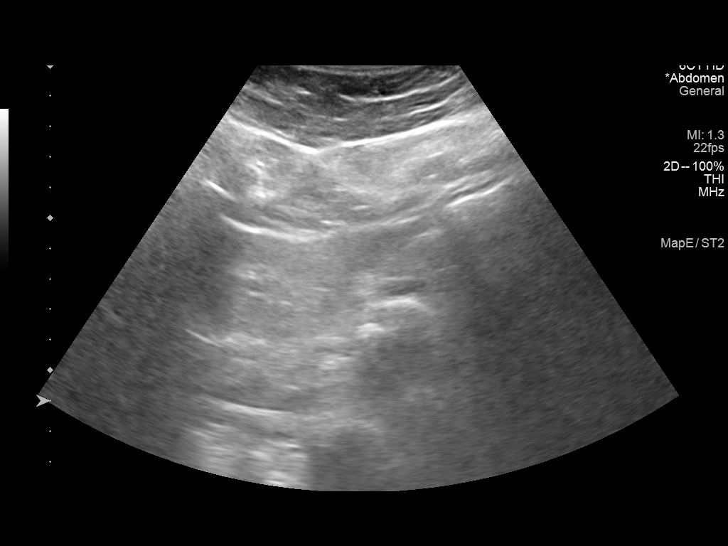
[im 29/86]
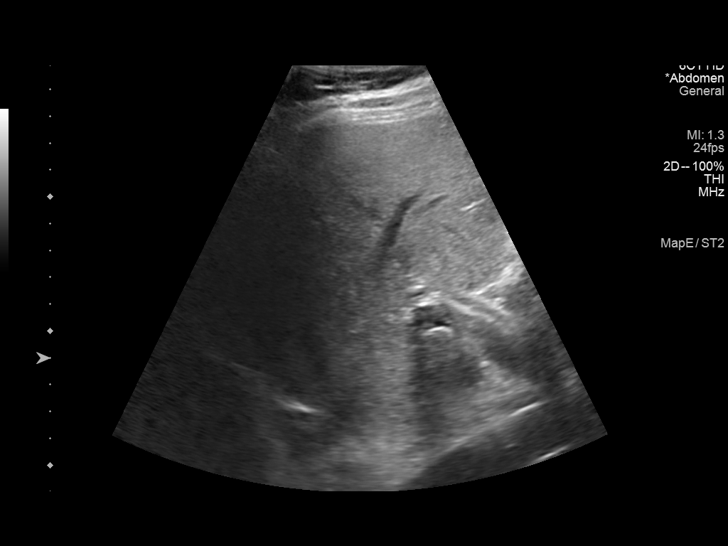
[im 32/86]
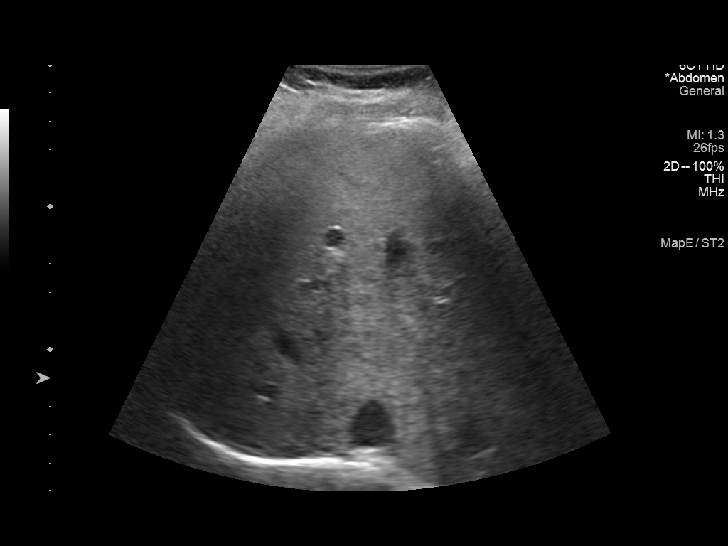
[im 39/86]
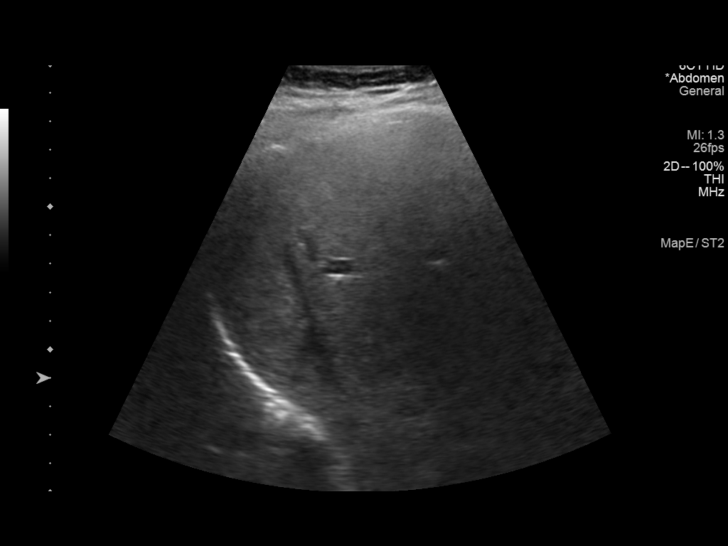
[im 47/86]
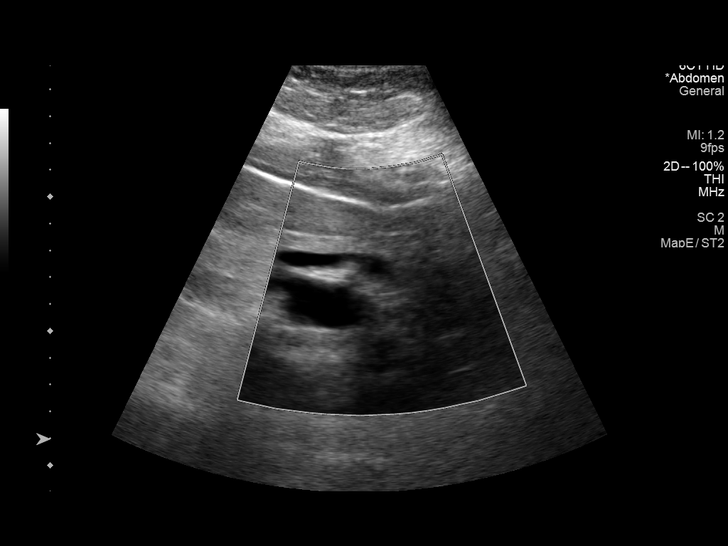
[im 54/86]
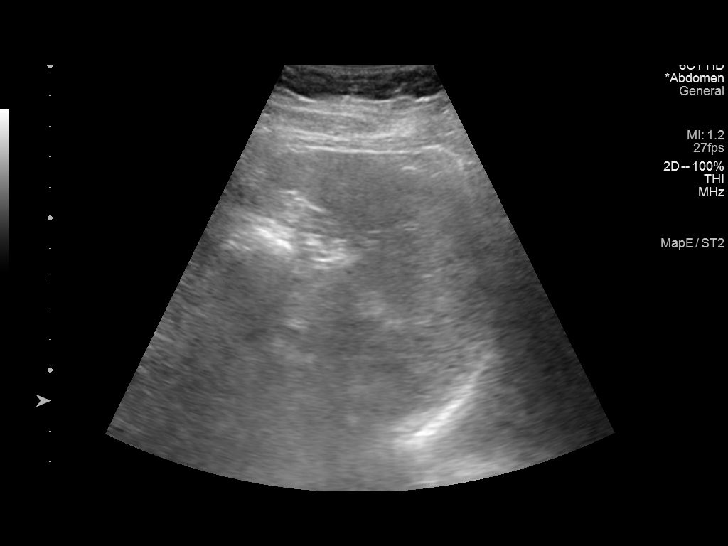
[im 57/86]
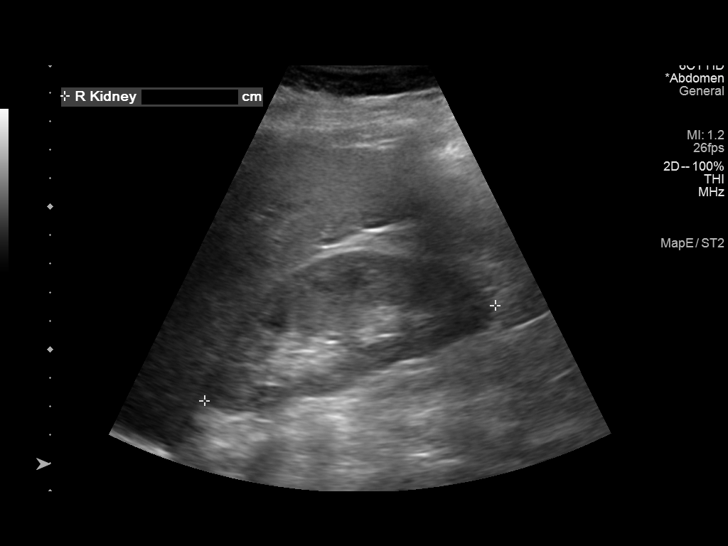
[im 64/86]
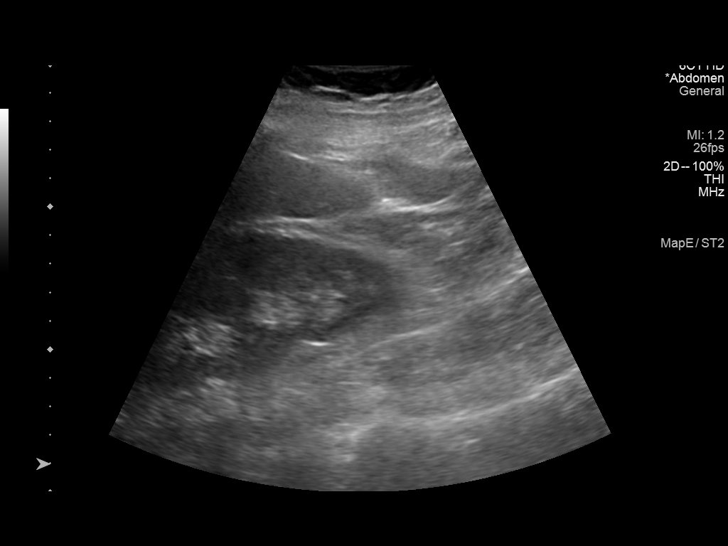
[im 71/86]
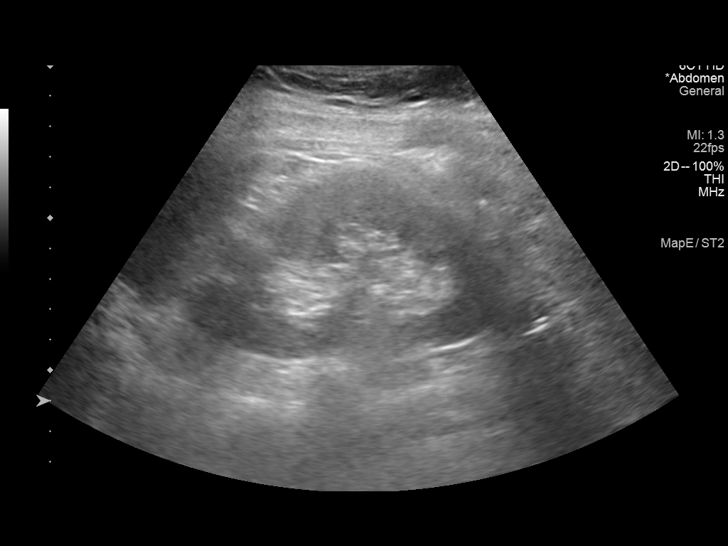
[im 78/86]
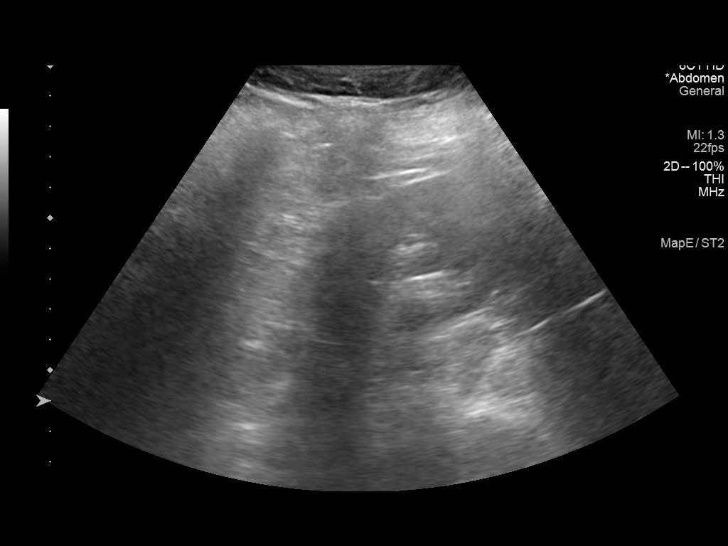
[im 86/86]
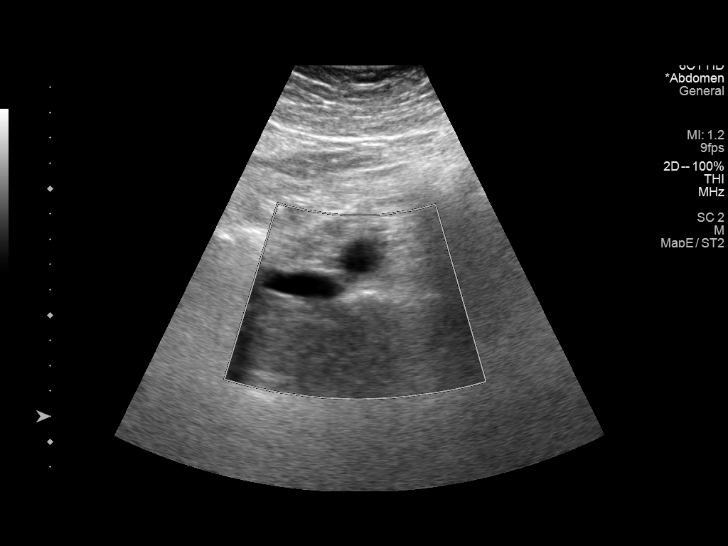

[14 of 25 positions shown; findings below may reference images not displayed]

FINDINGS: Gallbladder: No gallstones or wall thickening visualized (2.1 mm).
No sonographic Murphy sign noted by sonographer.

Common bile duct: Diameter: 2.4 mm

Liver: No focal lesion identified. Diffusely increased echogenicity
of the liver parenchyma is noted. Portal vein is patent on color
Doppler imaging with normal direction of blood flow towards the
liver.

IVC: No abnormality visualized.

Pancreas: Visualized portion unremarkable.

Spleen: Size (6.5 cm) and appearance within normal limits.

Right Kidney: Length: 10.7 cm. Echogenicity within normal limits. No
mass or hydronephrosis visualized.

Left Kidney: Length: 10.1 cm. Echogenicity within normal limits. No
mass or hydronephrosis visualized.

Abdominal aorta: No aneurysm visualized (1.9 cm in AP diameter).

Other findings: The study is limited secondary to the patient's body
habitus and overlying bowel gas.
IMPRESSION: Hepatic steatosis without focal liver lesions.

## 2023-08-08 ENCOUNTER — Telehealth: Payer: No Typology Code available for payment source | Admitting: Family Medicine

## 2023-08-08 ENCOUNTER — Encounter: Payer: Self-pay | Admitting: Family Medicine

## 2023-08-08 ENCOUNTER — Encounter: Payer: Self-pay | Admitting: Internal Medicine

## 2023-08-08 VITALS — Ht 66.0 in | Wt 175.0 lb

## 2023-08-08 DIAGNOSIS — F419 Anxiety disorder, unspecified: Secondary | ICD-10-CM | POA: Diagnosis not present

## 2023-08-08 DIAGNOSIS — Z566 Other physical and mental strain related to work: Secondary | ICD-10-CM | POA: Diagnosis not present

## 2023-08-08 DIAGNOSIS — U099 Post covid-19 condition, unspecified: Secondary | ICD-10-CM

## 2023-08-08 DIAGNOSIS — F5102 Adjustment insomnia: Secondary | ICD-10-CM | POA: Diagnosis not present

## 2023-08-08 MED ORDER — TRAZODONE HCL 50 MG PO TABS
50.0000 mg | ORAL_TABLET | Freq: Every day | ORAL | 2 refills | Status: DC
Start: 2023-08-08 — End: 2024-04-14

## 2023-08-08 NOTE — Progress Notes (Addendum)
Virtual Visit via Video Call     Date:  08/08/2023   ID:  Sheryl [name], DOB 09-15-1966, MRN [id number]  Patient Location:  At work  Provider location:   Biomedical engineer Complaint:  Work related Stress  History of Present Illness:    Sheryl [name] is a 57 y.o. female who presents via video conferencing for a telehealth visit today.     Patient is a 57 year old female who would like to discuss anxiety that is exacerbated by worked related stress.Patient suffers from severe anxiety ,depression and insomnia due to work related stress. 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   Patient states that she is overwhelmed by work, crying at work  due to being hopeless and overwhelmed.Forgetting key tasks and responsibilities.Mind racing and unable to stay focused on the task at hand and always anxious. She also reported she is also making an appointment with her therapist as well so that she can resume therapy. Patient has had treatment for adjustment disorder with mixed anxiety and depression in the past.        Past Medical History:  Diagnosis Date   Anxiety    is in therapy   Dysmenorrhea    Endocervical polyp    Fibroids    GERD (gastroesophageal reflux disease)    diet controlled   H/O varicella    History of chicken pox    History of measles    History of measles, mumps, or rubella    History of mumps    Menorrhagia    Nocturia    Unstable bladder    Urinary frequency    Yeast infection    Past Surgical History:  Procedure Laterality Date   MYOMECTOMY N/A 10/28/2013   Procedure: MYOMECTOMY;  Surgeon: Kirkland Hun, MD;  Location: [facility];  Service: Gynecology;  Laterality: N/A;     Current Meds  Medication Sig   Ascorbic Acid (VITAMIN C PO) Take by mouth.    benzonatate (TESSALON PERLES) 100 MG capsule Take 1 capsule (100 mg total) by mouth 3 (three) times daily as needed for cough.   Biotin w/ Vitamins C & E (HAIR/SKIN/NAILS PO) Take by mouth.   Calcium Carbonate (CALCIUM 600 PO) Take by mouth. 1 daily   Cholecalciferol (VITAMIN D3 PO) 5,000 Units daily.    Cyanocobalamin (B-12 PO) Take by mouth.   EPINEPHrine (EPIPEN 2-PAK) 0.3 mg/0.3 mL IJ SOAJ injection EpiPen 2-Pak 0.3 mg/0.3 mL injection, auto-injector   fluticasone (FLONASE) 50 MCG/ACT nasal spray 2 sprays via each nostril daily   ibuprofen (ADVIL,MOTRIN) 600 MG tablet 1 po pc q 6 hours x 5 days then prn-pain   levocetirizine (XYZAL) 5 MG tablet Take 1 tablet (5 mg total) by mouth every evening.   Multiple Vitamins-Minerals (MULTIVITAMIN ADULTS PO) multivitamin   Probiotic Product (RESTORA) CAPS TAKE 1 CAPSULE BY MOUTH EVERY DAY   traZODone (DESYREL) 50 MG tablet Take 1 tablet (50 mg total) by mouth at bedtime.   [DISCONTINUED] clonazePAM (KLONOPIN) 0.5 MG tablet Take 1 tablet (0.5 mg total) by mouth 2 (two) times daily. Prn anxiety (Patient not taking: Reported on 08/28/2023)   [DISCONTINUED] montelukast (SINGULAIR) 10 MG tablet TAKE 1 TABLET BY MOUTH EVERY DAY     Allergies:   Almond (diagnostic), Cat hair extract, and Milk-related  compounds   Social History   Tobacco Use   Smoking status: Never   Smokeless tobacco: Never  Vaping Use   Vaping status: Never Used  Substance Use Topics   Alcohol use: Yes    Comment: 3 glasses of wine montly   Drug use: No     Family Hx: The patient's family history includes Diabetes in her father and paternal grandfather; Diverticulitis in her mother. There is no history of Sleep apnea.  ROS:   Please see the history of present illness.    Review of Systems  Constitutional: Negative.   HENT: Negative.    Eyes: Negative.   Gastrointestinal: Negative.   Skin: Negative.   Psychiatric/Behavioral:  Positive for memory loss. The patient is  nervous/anxious and has insomnia.     All other systems reviewed and are negative.   Labs/Other Tests and Data Reviewed:    Recent Labs: 04/10/2023: ALT 136; BUN 15; Hemoglobin 14.4; Potassium 4.2; Sodium 142; TSH 1.42   Recent Lipid Panel Lab Results  Component Value Date/Time   CHOL 219 (A) 04/10/2023 12:00 AM   CHOL 227 (H) 05/03/2022 10:41 AM   TRIG 79 04/10/2023 12:00 AM   HDL 67 04/10/2023 12:00 AM   HDL 55 05/03/2022 10:41 AM   CHOLHDL 4.1 05/03/2022 10:41 AM   LDLCALC 14 04/10/2023 12:00 AM   LDLCALC 153 (H) 05/03/2022 10:41 AM    Wt Readings from Last 3 Encounters:  11/11/23 178 lb 6.4 oz (80.9 kg)  08/28/23 176 lb (79.8 kg)  08/08/23 175 lb (79.4 kg)     Exam:    Vital Signs:  Ht 5\' 6"  (1.676 m)   Wt 175 lb (79.4 kg)   LMP 04/15/2018 (Approximate)   BMI 28.25 kg/m     Physical Exam Nursing note reviewed: This visit was done virtually.  Neurological:     Mental Status: She is alert.     ASSESSMENT & PLAN:     There are no diagnoses linked to this encounter.   Time:   Today, I have spent 21 minutes/ seconds with the patient with telehealth technology discussing above diagnoses.     Medication Adjustments/Labs and Tests Ordered: Current medicines are reviewed at length with the patient today.  Concerns regarding medicines are outlined above.        Disposition:  Follow up with PCP  Signed, Ellender Hose NP

## 2023-08-12 ENCOUNTER — Encounter: Payer: Self-pay | Admitting: Family Medicine

## 2023-08-17 DIAGNOSIS — F419 Anxiety disorder, unspecified: Secondary | ICD-10-CM | POA: Insufficient documentation

## 2023-08-17 DIAGNOSIS — F5102 Adjustment insomnia: Secondary | ICD-10-CM | POA: Insufficient documentation

## 2023-08-17 DIAGNOSIS — Z566 Other physical and mental strain related to work: Secondary | ICD-10-CM | POA: Insufficient documentation

## 2023-08-28 ENCOUNTER — Ambulatory Visit: Payer: No Typology Code available for payment source | Admitting: Neurology

## 2023-08-28 ENCOUNTER — Encounter: Payer: Self-pay | Admitting: Neurology

## 2023-08-28 VITALS — BP 123/80 | HR 79 | Ht 66.0 in | Wt 176.0 lb

## 2023-08-28 DIAGNOSIS — G4733 Obstructive sleep apnea (adult) (pediatric): Secondary | ICD-10-CM | POA: Diagnosis not present

## 2023-08-28 NOTE — Progress Notes (Signed)
Subjective:    Patient ID: Sheryl [name] is a 57 y.o. female.  HPI    Interim history:   Sheryl [name] is a 57 year old right-handed woman with an underlying medical history of reflux disease, urinary frequency, uterine fibroids, hip pain, and overweight state, who presents for follow-up consultation of her obstructive sleep apnea, on AutoPap therapy.  The patient is unaccompanied today and presents for her one year check up. I last saw her on 05/01/2022, at which time she was compliant with treatment and doing well with her AutoPap.  I provided her with a sample mask but she called back reporting that it was not compatible with her tubing.  She was advised to take the mask and tubing to her DME provider for additional help. She had significant improvement in her daytime energy and headaches.  She was advised to follow-up routinely in 1 year.  We had to cancel an interim appointment unfortunately.  Today, 08/28/2023: I reviewed her AutoPap compliance data from 07/29/2023 through 08/27/2023, which is a total of 30 days, during which time she used her machine 21 days with percent use days greater than 4 hours at 70%, indicating adequate compliance, average usage of 6 hours and 35 minutes for days on treatment, average AHI 2.4/h, at goal, average pressure for the 95th percentile at 10.3 cm with a range of 5 to 11 cm with EPR of 3.  Leak on the lower side with the 95th percentile at 2.5 L/min.  She reports doing well with her machine, she does need new supplies.  She uses the nasal cushion interface which is very tolerable but lately she has noticed some pinching on the sides of her nose and discoloration, she has been using a Band-Aid over the area and it has worked out okay.  She is very motivated to continue with treatment.  She is working on better sleep habits and overall health, is currently in counseling as well.  She recently started trazodone at night to help her sleep, low-dose, 50 mg strength.  She  currently lives in [place].  Her Epworth sleepiness score is 6 out of 24.   The patient's allergies, current medications, family history, past medical history, past social history, past surgical history and problem list were reviewed and updated as appropriate.    Previously:  I first met her at the request of her primary care physician on 10/26/2021, at which time she reported snoring, morning headaches, nonrestorative sleep and daytime somnolence.  She was advised to proceed with a sleep study.  She had a home sleep test on 11/29/2021 which indicated moderate obstructive sleep apnea with an AHI of 19.4/h, O2 nadir 85%.  She was advised to proceed with AutoPap therapy.  Her set up date was 01/08/2022.  She has a ResMed AirSense 10 AutoSet machine.   I reviewed her AutoPap compliance data from 01/08/2022 through 02/06/2022, which is a total of 30 days, during which time she used her machine every night with percent use days greater than 4 hours at 100%, indicating superb compliance with an average usage of 7 hours and 4 minutes, residual AHI at goal at 3/h, average pressure for the 95th percentile at 10.1 cm with a range of 5 to 11 cm with EPR of 3.  Leak acceptable with the 95th percentile at 6.5 L/min. I also reviewed her most recent compliance data for the past month, and she has had excellent compliance.     10/26/21: (She) reports snoring, nonrestorative sleep, morning headaches,  and excessive daytime somnolence.  I reviewed your office note from 08/03/2021.  Her Epworth sleepiness score is 7 out of 24, fatigue severity score is 36 out of 63.  She has had worsening daytime tiredness.  She has a family history of snoring.  She has gained weight in the past couple of years in the realm of 20+ pounds.  She reports that she snored even before more weight gain.  She has nocturia about twice per average night.  She occasionally wakes up with a dull, achy headache, not debilitating but enough to take a Tylenol  as needed.  She does not have a TV in her bedroom, no pets in the house.  She lives alone, she has a 56 year old son and 1 grandchild.  She works for a [employer].  She works from home.  Bedtime is generally around 10 PM, rise time around 7 AM.  She drinks caffeine in the form of coffee, 1 cup/day on average, 1 soda per day on average.  She drinks alcohol occasionally, she is a non-smoker.      Her Past Medical History Is Significant For: Past Medical History:  Diagnosis Date   Anxiety    is in therapy   Dysmenorrhea    Endocervical polyp    Fibroids    GERD (gastroesophageal reflux disease)    diet controlled   H/O varicella    History of chicken pox    History of measles    History of measles, mumps, or rubella    History of mumps    Menorrhagia    Nocturia    Unstable bladder    Urinary frequency    Yeast infection     Her Past Surgical History Is Significant For: Past Surgical History:  Procedure Laterality Date   MYOMECTOMY N/A 10/28/2013   Procedure: MYOMECTOMY;  Surgeon: Kirkland Hun, MD;  Location: [facility];  Service: Gynecology;  Laterality: N/A;    Her Family History Is Significant For: Family History  Problem Relation Age of Onset   Diverticulitis Mother    Diabetes Father    Diabetes Paternal Grandfather    Sleep apnea Neg Hx     Her Social History Is Significant For: Social History   Socioeconomic History   Marital status: Single    Spouse name: Not on file   Number of children: Not on file   Years of education: Not on file   Highest education level: Not on file  Occupational History   Not on file  Tobacco Use   Smoking status: Never   Smokeless tobacco: Never  Vaping Use   Vaping status: Never Used  Substance and Sexual Activity   Alcohol use: Yes    Comment: 3 glasses of wine montly   Drug use: No   Sexual activity: Not on file  Other Topics Concern   Not on file  Social History Narrative   Caffeine: usually 1 cup coffee in the  mornings   Social Determinants of Health   Financial Resource Strain: Not on file  Food Insecurity: No Food Insecurity (08/22/2022)   [facility]   Hunger Vital Sign    Worried About Running Out of Food in the Last Year: Never true    Ran Out of Food in the Last Year: Never true  Transportation Needs: No Transportation Needs (08/22/2022)   Received from [facility], [facility]   PRAPARE - Transportation    Lack of Transportation (Medical): No  Lack of Transportation (Non-Medical): No  Physical Activity: Not on file  Stress: Not on file (01/26/2020)  Social Connections: Not on file    Her Allergies Are:  Allergies  Allergen Reactions   Almond (Diagnostic) Anaphylaxis   Cat Hair Extract    Milk-Related Compounds   :   Her Current Medications Are:  Outpatient Encounter Medications as of 08/28/2023  Medication Sig   Ascorbic Acid (VITAMIN C PO) Take by mouth.   benzonatate (TESSALON PERLES) 100 MG capsule Take 1 capsule (100 mg total) by mouth 3 (three) times daily as needed for cough.   Biotin w/ Vitamins C & E (HAIR/SKIN/NAILS PO) Take by mouth.   Calcium Carbonate (CALCIUM 600 PO) Take by mouth. 1 daily   Cholecalciferol (VITAMIN D3 PO) 5,000 Units daily.    Cyanocobalamin (B-12 PO) Take by mouth.   EPINEPHrine (EPIPEN 2-PAK) 0.3 mg/0.3 mL IJ SOAJ injection EpiPen 2-Pak 0.3 mg/0.3 mL injection, auto-injector   fluticasone (FLONASE) 50 MCG/ACT nasal spray 2 sprays via each nostril daily   ibuprofen (ADVIL,MOTRIN) 600 MG tablet 1 po pc q 6 hours x 5 days then prn-pain   levocetirizine (XYZAL) 5 MG tablet Take 1 tablet (5 mg total) by mouth every evening.   montelukast (SINGULAIR) 10 MG tablet TAKE 1 TABLET BY MOUTH EVERY DAY   Multiple Vitamins-Minerals (MULTIVITAMIN ADULTS PO) multivitamin   Probiotic Product (RESTORA) CAPS TAKE 1 CAPSULE BY MOUTH EVERY DAY   traZODone (DESYREL) 50 MG tablet Take 1 tablet (50 mg total) by mouth at bedtime.    [DISCONTINUED] clonazePAM (KLONOPIN) 0.5 MG tablet Take 1 tablet (0.5 mg total) by mouth 2 (two) times daily. Prn anxiety (Patient not taking: Reported on 08/28/2023)   No facility-administered encounter medications on file as of 08/28/2023.  :  Review of Systems:  Out of a complete 14 point review of systems, all are reviewed and negative with the exception of these symptoms as listed below:   Review of Systems  Neurological:        Patient is here alone for yearly cpap follow-up. She states everything is fine with her machine. She needs supplies.     Objective:  Neurological Exam  Physical Exam Physical Examination:   Vitals:   08/28/23 1318  BP: 123/80  Pulse: 79    General Examination: The patient is a very pleasant 57 y.o. female in no acute distress. She appears well-developed and well-nourished and well groomed.   HEENT: Normocephalic, atraumatic, pupils are equal, round and reactive to light, extraocular tracking is well preserved. Hearing is grossly intact. Face is symmetric with normal facial animation. Speech is clear with no dysarthria noted. There is no hypophonia. There is no lip, neck/head, jaw or voice tremor. Neck with FROM. There are no carotid bruits on auscultation. Oropharynx exam reveals: no significant mouth dryness, good dental hygiene and moderate airway crowding. Tongue protrudes centrally.  Mild evidence of discoloration around the nostrils laterally each side.  No open sores.     Chest: Clear to auscultation without wheezing, rhonchi or crackles noted.   Heart: S1+S2+0, regular and normal without murmurs, rubs or gallops noted.    Abdomen: Soft, non-tender and non-distended.   Extremities: There is no obvious swelling in the distal lower extremities bilaterally.    Skin: Warm and dry without trophic changes noted.    Musculoskeletal: exam reveals no obvious joint deformities.    Neurologically:  Mental status: The patient is awake, alert and  oriented in all 4 spheres. Her  immediate and remote memory, attention, language skills and fund of knowledge are appropriate. There is no evidence of aphasia, agnosia, apraxia or anomia. Speech is clear with normal prosody and enunciation. Thought process is linear. Mood is normal and affect is normal.  Cranial nerves II - XII are as described above under HEENT exam.  Motor exam: Normal bulk, strength and tone is noted. There is no obvious resting or action tremor. Fine motor skills and coordination: grossly intact.  Cerebellar testing: No dysmetria or intention tremor. There is no truncal or gait ataxia.  Sensory exam: intact to light touch in the upper and lower extremities.  Gait, station and balance: She stands easily. No veering to one side is noted. No leaning to one side is noted. Posture is age-appropriate and stance is narrow based. Gait shows normal stride length and normal pace. No problems turning are noted.    Assessment and Plan:  In summary, Sheryl [name] is a very pleasant 57 year old female with an underlying medical history of reflux disease, urinary frequency, uterine fibroids, hip pain, and overweight state, who presents for follow-up consultation of her obstructive sleep apnea, established on AutoPap therapy.  She has been compliant with treatment and has benefited from it.  She needs new supplies, nasal cushion interface has been tolerable for her.  She did not like the fullface mask.  I will place an order for supplies.  Of note, her home sleep test from 11/29/2021 showed an AHI of 19.4/h, O2 nadir 85%.  She has been on AutoPap therapy since late January 2023. She is commended for her treatment adherence and is advised to continue to use AutoPap therapy with full compliance. At this juncture, she is advised to follow-up routinely in 1 year to see one of our nurse practitioners.  We can offer her a video visit appointment, as she has moved to [place].   I answered all her questions  today and she was in agreement. I spent 20 minutes in total face-to-face time and in reviewing records during pre-charting, more than 50% of which was spent in counseling and coordination of care, reviewing test results, reviewing medications and treatment regimen and/or in discussing or reviewing the diagnosis of OSA, the prognosis and treatment options. Pertinent laboratory and imaging test results that were available during this visit with the patient were reviewed by me and considered in my medical decision making (see chart for details).

## 2023-08-28 NOTE — Patient Instructions (Signed)
It was nice to see you again.   Please continue using your autoPAP regularly. While your insurance requires that you use PAP at least 4 hours each night on 70% of the nights, I recommend, that you not skip any nights and use it throughout the night if you can. Getting used to PAP and staying with the treatment long term does take time and patience and discipline. Untreated obstructive sleep apnea when it is moderate to severe can have an adverse impact on cardiovascular health and raise her risk for heart disease, arrhythmias, hypertension, congestive heart failure, stroke and diabetes. Untreated obstructive sleep apnea causes sleep disruption, nonrestorative sleep, and sleep deprivation. This can have an impact on your day to day functioning and cause daytime sleepiness and impairment of cognitive function, memory loss, mood disturbance, and problems focussing. Using PAP regularly can improve these symptoms.   We can see you in 1 year, you can see one of our nurse practitioners as you are stable.

## 2023-09-19 ENCOUNTER — Encounter: Payer: Self-pay | Admitting: Internal Medicine

## 2023-09-19 ENCOUNTER — Other Ambulatory Visit: Payer: Self-pay | Admitting: Family Medicine

## 2023-10-17 LAB — HM MAMMOGRAPHY

## 2023-10-25 NOTE — Progress Notes (Signed)
SCANNED DOCUMENT

## 2023-10-26 ENCOUNTER — Other Ambulatory Visit: Payer: Self-pay | Admitting: Internal Medicine

## 2023-11-11 ENCOUNTER — Encounter: Payer: Self-pay | Admitting: Internal Medicine

## 2023-11-11 ENCOUNTER — Ambulatory Visit: Payer: No Typology Code available for payment source | Admitting: Internal Medicine

## 2023-11-11 VITALS — BP 134/88 | HR 68 | Temp 98.5°F | Ht 66.0 in | Wt 178.4 lb

## 2023-11-11 DIAGNOSIS — E8881 Metabolic syndrome: Secondary | ICD-10-CM

## 2023-11-11 DIAGNOSIS — D234 Other benign neoplasm of skin of scalp and neck: Secondary | ICD-10-CM

## 2023-11-11 DIAGNOSIS — L821 Other seborrheic keratosis: Secondary | ICD-10-CM | POA: Diagnosis not present

## 2023-11-11 DIAGNOSIS — R030 Elevated blood-pressure reading, without diagnosis of hypertension: Secondary | ICD-10-CM | POA: Diagnosis not present

## 2023-11-11 DIAGNOSIS — L659 Nonscarring hair loss, unspecified: Secondary | ICD-10-CM | POA: Diagnosis not present

## 2023-11-11 DIAGNOSIS — I10 Essential (primary) hypertension: Secondary | ICD-10-CM | POA: Insufficient documentation

## 2023-11-11 NOTE — Progress Notes (Unsigned)
I,Victoria T Deloria Lair, CMA,acting as a Neurosurgeon for Gwynneth Aliment, MD.,have documented all relevant documentation on the behalf of Gwynneth Aliment, MD,as directed by  Gwynneth Aliment, MD while in the presence of Gwynneth Aliment, MD.  Subjective:  Patient ID: Sheryl [name] , female    DOB: 20-Dec-1965 , 57 y.o.   MRN: [id number]  Chief Complaint  Patient presents with   Abnormal Glucose     HPI  Patient presents today for abnormal glucose follow up. She currently does not take any prescribed medications. While here today, she would like the mole located on her scalp looked at. She feels it may be growing in size. Iti is sometimes tender, she has not noticed any drainage.       Past Medical History:  Diagnosis Date   Anxiety    is in therapy   Dysmenorrhea    Endocervical polyp    Fibroids    GERD (gastroesophageal reflux disease)    diet controlled   H/O varicella    History of chicken pox    History of measles    History of measles, mumps, or rubella    History of mumps    Menorrhagia    Nocturia    Unstable bladder    Urinary frequency    Yeast infection      Family History  Problem Relation Age of Onset   Diverticulitis Mother    Diabetes Father    Diabetes Paternal Grandfather    Sleep apnea Neg Hx      Current Outpatient Medications:    Ascorbic Acid (VITAMIN C PO), Take by mouth., Disp: , Rfl:    benzonatate (TESSALON PERLES) 100 MG capsule, Take 1 capsule (100 mg total) by mouth 3 (three) times daily as needed for cough., Disp: 30 capsule, Rfl: 1   Biotin w/ Vitamins C & E (HAIR/SKIN/NAILS PO), Take by mouth., Disp: , Rfl:    Calcium Carbonate (CALCIUM 600 PO), Take by mouth. 1 daily, Disp: , Rfl:    Cholecalciferol (VITAMIN D3 PO), 5,000 Units daily. , Disp: , Rfl:    Cyanocobalamin (B-12 PO), Take by mouth., Disp: , Rfl:    EPINEPHrine (EPIPEN 2-PAK) 0.3 mg/0.3 mL IJ SOAJ injection, EpiPen 2-Pak 0.3 mg/0.3 mL injection, auto-injector, Disp: 1 each, Rfl:  2   fluticasone (FLONASE) 50 MCG/ACT nasal spray, 2 sprays via each nostril daily, Disp: 48 g, Rfl: 2   ibuprofen (ADVIL,MOTRIN) 600 MG tablet, 1 po pc q 6 hours x 5 days then prn-pain, Disp: 30 tablet, Rfl: 1   levocetirizine (XYZAL) 5 MG tablet, Take 1 tablet (5 mg total) by mouth every evening., Disp: 90 tablet, Rfl: 1   montelukast (SINGULAIR) 10 MG tablet, TAKE 1 TABLET BY MOUTH EVERY DAY, Disp: 90 tablet, Rfl: 1   Multiple Vitamins-Minerals (MULTIVITAMIN ADULTS PO), multivitamin, Disp: , Rfl:    Probiotic Product (RESTORA) CAPS, TAKE 1 CAPSULE BY MOUTH EVERY DAY, Disp: 30 capsule, Rfl: 5   traZODone (DESYREL) 50 MG tablet, Take 1 tablet (50 mg total) by mouth at bedtime., Disp: 30 tablet, Rfl: 2   Allergies  Allergen Reactions   Almond (Diagnostic) Anaphylaxis   Cat Hair Extract    Milk-Related Compounds      Review of Systems  Constitutional: Negative.   Respiratory: Negative.    Cardiovascular: Negative.   Gastrointestinal: Negative.   Neurological: Negative.   Psychiatric/Behavioral: Negative.       Today's Vitals   11/11/23 1133 11/11/23 1202  BP: Marland Kitchen)  144/82 134/88  Pulse: 68   Temp: 98.5 F (36.9 C)   SpO2: 98%   Weight: 178 lb 6.4 oz (80.9 kg)   Height: 5\' 6"  (1.676 m)    Body mass index is 28.79 kg/m.  Wt Readings from Last 3 Encounters:  11/11/23 178 lb 6.4 oz (80.9 kg)  08/28/23 176 lb (79.8 kg)  08/08/23 175 lb (79.4 kg)     Objective:  Physical Exam Vitals and nursing note reviewed.  Constitutional:      Appearance: Normal appearance.  HENT:     Head: Normocephalic and atraumatic.  Eyes:     Extraocular Movements: Extraocular movements intact.  Cardiovascular:     Rate and Rhythm: Normal rate and regular rhythm.     Heart sounds: Normal heart sounds.  Pulmonary:     Effort: Pulmonary effort is normal.     Breath sounds: Normal breath sounds.  Musculoskeletal:     Cervical back: Normal range of motion.  Skin:    General: Skin is warm.      Comments: Hyperpigmented irregularly shaped stuck on plaque - RL abdomen  Flesh colored papule in scalp  Neurological:     General: No focal deficit present.     Mental Status: She is alert.  Psychiatric:        Mood and Affect: Mood normal.        Behavior: Behavior normal.         Assessment And Plan:  Metabolic syndrome Assessment & Plan: She is encouraged to decrease her intake of sugary foods, processed meats and to increase her activity level - aiming for at least 150 minutes of exercise per week.   Orders: -     Insulin, random -     Hemoglobin A1c  Elevated blood pressure reading Assessment & Plan: Pt advised of elevated BP reading. Encouraged to adopt DASH diet and avoid processed meats including bacon, sausages and deli meats. Also advised to avoid adding salt to her foods. She agrees to f/u in four weeks for BP check. If elevated, I plan to start her on medication.    Hair loss Assessment & Plan: She has been seen by Derm in the past; however, she is now living in [place]. She would like referral to [facility] provider, but wants to wait until she sends [place] the name(s) of her preferred provider. I will check iron level today.   Orders: -     CBC; Future -     Iron, TIBC and Ferritin Panel; Future -     ANA, IFA (with reflex)  Seborrheic keratosis Assessment & Plan: Previous Derm records reviewed. I agree that the abdominal lesion is likely due to seborrheic keratosis. No cause for concern.    Dermal nevus of scalp Assessment & Plan: Again, I will refer her to Derm, awaiting name so referral can be placed. Since it causes her discomfort at times, will consider excision.       Return in 4 weeks (on 12/09/2023), or bp check.  Patient was given opportunity to ask questions. Patient verbalized understanding of the plan and was able to repeat key elements of the plan. All questions were answered to their satisfaction.    I, Gwynneth Aliment, MD, have reviewed all  documentation for this visit. The documentation on 11/11/23 for the exam, diagnosis, procedures, and orders are all accurate and complete.   IF YOU HAVE BEEN REFERRED TO A SPECIALIST, IT MAY TAKE 1-2 WEEKS TO SCHEDULE/PROCESS THE REFERRAL. IF  YOU HAVE NOT HEARD FROM US/SPECIALIST IN TWO WEEKS, PLEASE GIVE [place] A CALL AT [phone] X [phone].   THE PATIENT IS ENCOURAGED TO PRACTICE SOCIAL DISTANCING DUE TO THE COVID-19 PANDEMIC.

## 2023-11-11 NOTE — Patient Instructions (Addendum)
Tart cherry juice mocktail for sleep Epsom salt soak Magnesium spray on feet Magnesium glycinate L-theanine  Metabolic Syndrome Metabolic syndrome occurs when you have a combination of three or more factors that increase your chances of developing cardiovascular disease and diabetes. These factors include: High fasting blood sugar (glucose). High blood triglyceride level. High blood pressure. Low levels of high-density lipoprotein (HDL) blood cholesterol. Having a waist measurement that is: More than 40 inches in men. More than 35 inches in women. Metabolic syndrome is sometimes called insulin resistance syndrome or syndrome X. What are the causes? The exact cause of this condition is not known. It may be related to a combination of the factors that were passed down from your parents (genes) and the things that you do, eat, and drink (lifestyle choices). What increases the risk? You are more likely to develop this condition if you: Eat a diet that is high in calories and saturated fat. Do not exercise regularly. Are obese. Have a family history of type 2 diabetes mellitus. Have insulin resistance. Have a history of gestational diabetes during a previous pregnancy. Have conditions such as cardiovascular disease, nonalcoholic fatty liver disease, or polycystic ovary syndrome (PCOS). Are older. The risk increases with age. Use any tobacco products, including cigarettes, chewing tobacco, or e-cigarettes. What are the signs or symptoms? Metabolic syndrome has no specific symptoms. Having abnormal blood test results may be the only sign of metabolic syndrome. How is this diagnosed? This condition may be diagnosed based on: Your blood pressure measurements. Your waist measurement. Blood tests. Your personal and family medical history. How is this treated? Treatment may include: Lifestyle changes to reduce your risk for heart disease, stroke, and diabetes. These  include: Exercise. Weight loss. Eating a healthy diet. Stopping tobacco and nicotine use. Medicines that: Help your body maintain normal blood glucose levels. Lower your blood pressure and your blood triglyceride levels. You may be referred to a behavioral counselor who will help you make lifestyle changes that will lower your risk for serious disease. Follow these instructions at home: Lifestyle     Exercise regularly, as told by your health care provider. Eat a healthy diet that includes fresh fruits and vegetables, whole grains, lean proteins, and low-fat or nonfat dairy products. Do not use any products that contain nicotine or tobacco, such as cigarettes, e-cigarettes, and chewing tobacco. If you need help quitting, ask your health care provider. Maintain a healthy weight. Work with your health care provider to lose weight safely, if needed. General instructions Take over-the-counter and prescription medicines only as told by your health care provider. If directed, measure your waist regularly and write down the measurements. To measure your waist: Stand up straight. Breathe out. Wrap a measuring tape around the part of your waist that is just above your hip bones. Read and write down the measurement. Keep all follow-up visits. This is important. Contact a health care provider if: You feel very tired. You are extremely thirsty. You urinate a lot more than usual. Your waist gets bigger. You have headaches that do not go away. Get help right away if: You suddenly develop any of the following: Dizziness. Blurry vision. Trouble speaking. Trouble swallowing. Weakness in an arm or leg. Chest pain. Trouble breathing. Your heartbeat feels abnormal. You faint. Summary Metabolic syndrome occurs when you have a combination of three or more factors that increase your chances of developing cardiovascular disease and diabetes. These factors include high fasting blood sugar  (glucose), high blood triglyceride level,  high blood pressure, low levels of high-density lipoprotein (HDL) blood cholesterol, and a waist measurement that is more than 40 inches in men or more than 35 inches in women. Metabolic syndrome has no specific symptoms. Having abnormal blood test results may be the only signs of metabolic syndrome. Treatment may include lifestyle changes and medicines to reduce your risk for heart disease, stroke, and diabetes. This information is not intended to replace advice given to you by your health care provider. Make sure you discuss any questions you have with your health care provider. Document Revised: 04/25/2020 Document Reviewed: 04/25/2020 Elsevier Patient Education  2024 [employer].

## 2023-11-13 LAB — INSULIN, RANDOM: INSULIN: 22.7 u[IU]/mL (ref 2.6–24.9)

## 2023-11-13 LAB — HEMOGLOBIN A1C
Est. average glucose Bld gHb Est-mCnc: 123 mg/dL
Hgb A1c MFr Bld: 5.9 % — ABNORMAL HIGH (ref 4.8–5.6)

## 2023-11-13 LAB — ANTINUCLEAR ANTIBODIES, IFA: ANA Titer 1: NEGATIVE

## 2023-11-13 NOTE — Assessment & Plan Note (Addendum)
Pt advised of elevated BP reading. Encouraged to adopt DASH diet and avoid processed meats including bacon, sausages and deli meats. Also advised to avoid adding salt to her foods. She agrees to f/u in four weeks for BP check. If elevated, I plan to start her on medication.

## 2023-11-13 NOTE — Assessment & Plan Note (Signed)
Previous Derm records reviewed. I agree that the abdominal lesion is likely due to seborrheic keratosis. No cause for concern.

## 2023-11-13 NOTE — Assessment & Plan Note (Signed)
She has been seen by Derm in the past; however, she is now living in [place]. She would like referral to [facility] provider, but wants to wait until she sends [place] the name(s) of her preferred provider. I will check iron level today.

## 2023-11-13 NOTE — Assessment & Plan Note (Signed)
Again, I will refer her to Derm, awaiting name so referral can be placed. Since it causes her discomfort at times, will consider excision.

## 2023-11-13 NOTE — Assessment & Plan Note (Signed)
She is encouraged to decrease her intake of sugary foods, processed meats and to increase her activity level - aiming for at least 150 minutes of exercise per week.

## 2023-11-27 ENCOUNTER — Other Ambulatory Visit: Payer: Self-pay | Admitting: Family Medicine

## 2023-12-21 ENCOUNTER — Other Ambulatory Visit: Payer: Self-pay | Admitting: Internal Medicine

## 2023-12-31 ENCOUNTER — Ambulatory Visit: Payer: No Typology Code available for payment source | Admitting: Internal Medicine

## 2023-12-31 ENCOUNTER — Encounter: Payer: Self-pay | Admitting: Internal Medicine

## 2023-12-31 VITALS — BP 130/90 | HR 77 | Temp 98.6°F | Ht 66.0 in | Wt 178.6 lb

## 2023-12-31 DIAGNOSIS — M5432 Sciatica, left side: Secondary | ICD-10-CM

## 2023-12-31 DIAGNOSIS — R030 Elevated blood-pressure reading, without diagnosis of hypertension: Secondary | ICD-10-CM

## 2023-12-31 NOTE — Progress Notes (Signed)
 I,[name], CMA,acting as a neurosurgeon for [name], MD.,have documented all relevant documentation on the behalf of [name], MD,as directed by  [name], MD while in the presence of [name], MD.  Subjective:  Patient ID: Sheryl [name] , female    DOB: December 31, 1965 , 58 y.o.   MRN: [id number]  Chief Complaint  Patient presents with   Hypertension    HPI  Patient presents today for elevated bp follow up. She currently does not take anything prescribed for blood pressure. Denies headache, chest pain & sob.  She complains of a limp. She does not know what this is due to. No recent falls or injuries. She reports going to Acupuncture on 12/19/23.      Past Medical History:  Diagnosis Date   Anxiety    is in therapy   Dysmenorrhea    Endocervical polyp    Fibroids    GERD (gastroesophageal reflux disease)    diet controlled   H/O varicella    History of chicken pox    History of measles    History of measles, mumps, or rubella    History of mumps    Menorrhagia    Nocturia    Unstable bladder    Urinary frequency    Yeast infection      Family History  Problem Relation Age of Onset   Diverticulitis Mother    Diabetes Father    Diabetes Paternal Grandfather    Sleep apnea Neg Hx      Current Outpatient Medications:    Ascorbic Acid (VITAMIN C PO), Take by mouth., Disp: , Rfl:    benzonatate  (TESSALON  PERLES) 100 MG capsule, Take 1 capsule (100 mg total) by mouth 3 (three) times daily as needed for cough., Disp: 30 capsule, Rfl: 1   Biotin w/ Vitamins C & E (HAIR/SKIN/NAILS PO), Take by mouth., Disp: , Rfl:    Calcium Carbonate (CALCIUM 600 PO), Take by mouth. 1 daily, Disp: , Rfl:    Cholecalciferol (VITAMIN D3 PO), 5,000 Units daily. , Disp: , Rfl:    Cyanocobalamin (B-12 PO), Take by mouth., Disp: , Rfl:    EPINEPHrine  (EPIPEN  2-PAK) 0.3 mg/0.3 mL IJ SOAJ injection, EpiPen  2-Pak 0.3 mg/0.3 mL injection, auto-injector, Disp: 1 each,  Rfl: 2   fluticasone  (FLONASE ) 50 MCG/ACT nasal spray, 2 sprays via each nostril daily, Disp: 48 g, Rfl: 2   ibuprofen  (ADVIL ,MOTRIN ) 600 MG tablet, 1 po pc q 6 hours x 5 days then prn-pain, Disp: 30 tablet, Rfl: 1   levocetirizine (XYZAL ) 5 MG tablet, TAKE 1 TABLET BY MOUTH EVERY DAY IN THE EVENING, Disp: 90 tablet, Rfl: 1   montelukast  (SINGULAIR ) 10 MG tablet, TAKE 1 TABLET BY MOUTH EVERY DAY, Disp: 90 tablet, Rfl: 1   Multiple Vitamins-Minerals (MULTIVITAMIN ADULTS PO), multivitamin, Disp: , Rfl:    Probiotic Product (RESTORA) CAPS, TAKE 1 CAPSULE BY MOUTH EVERY DAY, Disp: 30 capsule, Rfl: 5   traZODone  (DESYREL ) 50 MG tablet, Take 1 tablet (50 mg total) by mouth at bedtime., Disp: 30 tablet, Rfl: 2   Allergies  Allergen Reactions   Almond (Diagnostic) Anaphylaxis   Cat Dander    Milk-Related Compounds      Review of Systems  Constitutional: Negative.   Respiratory: Negative.    Cardiovascular: Negative.   Neurological: Negative.   Psychiatric/Behavioral: Negative.       Today's Vitals   12/31/23 1134 12/31/23 1154  BP: 122/88 (!) 130/90  Pulse:  77   Temp: 98.6 F (37 C)   SpO2: 98%   Weight: 178 lb 9.6 oz (81 kg)   Height: 5' 6 (1.676 m)    Body mass index is 28.83 kg/m.  Wt Readings from Last 3 Encounters:  12/31/23 178 lb 9.6 oz (81 kg)  11/11/23 178 lb 6.4 oz (80.9 kg)  08/28/23 176 lb (79.8 kg)    BP Readings from Last 3 Encounters:  12/31/23 (!) 130/90  11/11/23 134/88  08/28/23 123/80    Objective:  Physical Exam Vitals and nursing note reviewed.  Constitutional:      Appearance: Normal appearance.  HENT:     Head: Normocephalic and atraumatic.  Eyes:     Extraocular Movements: Extraocular movements intact.  Cardiovascular:     Rate and Rhythm: Normal rate and regular rhythm.     Heart sounds: Normal heart sounds.  Pulmonary:     Effort: Pulmonary effort is normal.     Breath sounds: Normal breath sounds.  Musculoskeletal:        General:  Tenderness present.     Cervical back: Normal range of motion.  Skin:    General: Skin is warm.  Neurological:     General: No focal deficit present.     Mental Status: She is alert.  Psychiatric:        Mood and Affect: Mood normal.        Behavior: Behavior normal.         Assessment And Plan:  Elevated blood pressure reading Assessment & Plan: Pt advised of persistently elevated BP reading. This could also be exacerbated by pain.  Will not start meds today.  Encouraged to adopt DASH diet and avoid processed meats including bacon, sausages and deli meats. Also advised to avoid adding salt to her foods. She will f/u in six weeks for NP visit. If needed, will start meds at that time.    Sciatica of left side Assessment & Plan: She is encouraged to incorporate stretching exercises. May benefit from chiropractic therapy if persistent.       Return in 8 weeks (on 02/25/2024).  Patient was given opportunity to ask questions. Patient verbalized understanding of the plan and was able to repeat key elements of the plan. All questions were answered to their satisfaction.    I, [name], MD, have reviewed all documentation for this visit. The documentation on 12/31/23 for the exam, diagnosis, procedures, and orders are all accurate and complete.   IF YOU HAVE BEEN REFERRED TO A SPECIALIST, IT MAY TAKE 1-2 WEEKS TO SCHEDULE/PROCESS THE REFERRAL. IF YOU HAVE NOT HEARD FROM US /SPECIALIST IN TWO WEEKS, PLEASE GIVE US  A CALL AT [phone].   THE PATIENT IS ENCOURAGED TO PRACTICE SOCIAL DISTANCING DUE TO THE COVID-19 PANDEMIC.

## 2024-01-12 DIAGNOSIS — M5432 Sciatica, left side: Secondary | ICD-10-CM | POA: Insufficient documentation

## 2024-01-12 NOTE — Assessment & Plan Note (Signed)
Pt advised of persistently elevated BP reading. This could also be exacerbated by pain.  Will not start meds today.  Encouraged to adopt DASH diet and avoid processed meats including bacon, sausages and deli meats. Also advised to avoid adding salt to her foods. She will f/u in six weeks for NP visit. If needed, will start meds at that time.

## 2024-01-12 NOTE — Assessment & Plan Note (Signed)
She is encouraged to incorporate stretching exercises. May benefit from chiropractic therapy if persistent.

## 2024-02-12 ENCOUNTER — Encounter: Payer: Self-pay | Admitting: Family Medicine

## 2024-02-12 ENCOUNTER — Ambulatory Visit: Payer: No Typology Code available for payment source | Admitting: Family Medicine

## 2024-02-12 VITALS — BP 140/80 | HR 91 | Temp 98.2°F | Ht 66.0 in | Wt 178.0 lb

## 2024-02-12 DIAGNOSIS — Z566 Other physical and mental strain related to work: Secondary | ICD-10-CM

## 2024-02-12 DIAGNOSIS — Z0289 Encounter for other administrative examinations: Secondary | ICD-10-CM

## 2024-02-12 DIAGNOSIS — F419 Anxiety disorder, unspecified: Secondary | ICD-10-CM

## 2024-02-12 NOTE — Progress Notes (Addendum)
 I,Jameka J Llittleton, CMA,acting as a Neurosurgeon for [employer], NP.,have documented all relevant documentation on the behalf of Ellender Hose, NP,as directed by  Ellender Hose, NP while in the presence of Ellender Hose, NP.  Subjective:  Patient ID: Sheryl [name] , female    DOB: [birth year] , 58 y.o.   MRN: [id number]  Chief Complaint  Patient presents with   FMLA paperwork    HPI  Patient is a 58 year old female who presents today for evaluation and to have  some forms completed for her return to work. Patient has been out of work for about 6 months due to work related anxiety and stress. She states that she feels much better after her series of therapy and she is ready to get back to work.  Patient's BP is not WNL today, she has a 6 week follow up with Dr Allyne Gee and refuses to discuss it this visit.       Past Medical History:  Diagnosis Date   Anxiety    is in therapy   Dysmenorrhea    Endocervical polyp    Fibroids    GERD (gastroesophageal reflux disease)    diet controlled   H/O varicella    History of chicken pox    History of measles    History of measles, mumps, or rubella    History of mumps    Menorrhagia    Nocturia    Unstable bladder    Urinary frequency    Yeast infection      Family History  Problem Relation Age of Onset   Diverticulitis Mother    Diabetes Father    Diabetes Paternal Grandfather    Sleep apnea Neg Hx      Current Outpatient Medications:    Ascorbic Acid (VITAMIN C PO), Take by mouth., Disp: , Rfl:    benzonatate (TESSALON PERLES) 100 MG capsule, Take 1 capsule (100 mg total) by mouth 3 (three) times daily as needed for cough., Disp: 30 capsule, Rfl: 1   Biotin w/ Vitamins C & E (HAIR/SKIN/NAILS PO), Take by mouth., Disp: , Rfl:    Calcium Carbonate (CALCIUM 600 PO), Take by mouth. 1 daily, Disp: , Rfl:    Cholecalciferol (VITAMIN D3 PO), 5,000 Units daily. , Disp: , Rfl:    Cyanocobalamin (B-12 PO), Take by mouth., Disp: , Rfl:     EPINEPHrine (EPIPEN 2-PAK) 0.3 mg/0.3 mL IJ SOAJ injection, EpiPen 2-Pak 0.3 mg/0.3 mL injection, auto-injector, Disp: 1 each, Rfl: 2   fluticasone (FLONASE) 50 MCG/ACT nasal spray, 2 sprays via each nostril daily, Disp: 48 g, Rfl: 2   ibuprofen (ADVIL,MOTRIN) 600 MG tablet, 1 po pc q 6 hours x 5 days then prn-pain, Disp: 30 tablet, Rfl: 1   levocetirizine (XYZAL) 5 MG tablet, TAKE 1 TABLET BY MOUTH EVERY DAY IN THE EVENING, Disp: 90 tablet, Rfl: 1   montelukast (SINGULAIR) 10 MG tablet, TAKE 1 TABLET BY MOUTH EVERY DAY, Disp: 90 tablet, Rfl: 1   Multiple Vitamins-Minerals (MULTIVITAMIN ADULTS PO), multivitamin, Disp: , Rfl:    Probiotic Product (RESTORA) CAPS, TAKE 1 CAPSULE BY MOUTH EVERY DAY, Disp: 30 capsule, Rfl: 5   traZODone (DESYREL) 50 MG tablet, Take 1 tablet (50 mg total) by mouth at bedtime., Disp: 30 tablet, Rfl: 2   Allergies  Allergen Reactions   Almond (Diagnostic) Anaphylaxis   Cat Dander    Milk-Related Compounds      Review of Systems  Constitutional: Negative.   HENT: Negative.  Eyes: Negative.   Respiratory: Negative.    Cardiovascular: Negative.   Musculoskeletal: Negative.   Psychiatric/Behavioral: Negative.       Today's Vitals   02/12/24 1043  BP: (!) 140/80  Pulse: 91  Temp: 98.2 F (36.8 C)  TempSrc: Oral  Weight: 178 lb (80.7 kg)  Height: 5\' 6"  (1.676 m)  PainSc: 0-No pain   Body mass index is 28.73 kg/m.  Wt Readings from Last 3 Encounters:  02/12/24 178 lb (80.7 kg)  12/31/23 178 lb 9.6 oz (81 kg)  11/11/23 178 lb 6.4 oz (80.9 kg)    The 10-year ASCVD risk score (Arnett DK, et al., 2019) is: 5.3%*   Values used to calculate the score:     Age: 68 years     Sex: Female     Is Non-Hispanic African American: Yes     Diabetic: No     Tobacco smoker: No     Systolic Blood Pressure: 140 mmHg     Is BP treated: No     HDL Cholesterol: 67 mg/dL*     Total Cholesterol: 219 mg/dL*     * - Cholesterol units were assumed for this score  calculation  Objective:  Physical Exam HENT:     Head: Normocephalic.  Cardiovascular:     Rate and Rhythm: Normal rate and regular rhythm.  Pulmonary:     Effort: Pulmonary effort is normal.     Breath sounds: Normal breath sounds.  Neurological:     Mental Status: She is alert.         Assessment And Plan:  Work-related stress  Encounter for completion of form with patient    No follow-ups on file.  Patient was given opportunity to ask questions. Patient verbalized understanding of the plan and was able to repeat key elements of the plan. All questions were answered to their satisfaction.   I, Ellender Hose, NP, have reviewed all documentation for this visit. The documentation on 02/18/2024 for the exam, diagnosis, procedures, and orders are all accurate and complete.   IF YOU HAVE BEEN REFERRED TO A SPECIALIST, IT MAY TAKE 1-2 WEEKS TO SCHEDULE/PROCESS THE REFERRAL. IF YOU HAVE NOT HEARD FROM US/SPECIALIST IN TWO WEEKS, PLEASE GIVE [place] A CALL AT [phone] X [phone].

## 2024-02-14 ENCOUNTER — Encounter: Payer: Self-pay | Admitting: Family Medicine

## 2024-02-18 DIAGNOSIS — Z0289 Encounter for other administrative examinations: Secondary | ICD-10-CM | POA: Insufficient documentation

## 2024-02-19 ENCOUNTER — Ambulatory Visit: Payer: No Typology Code available for payment source | Admitting: Family Medicine

## 2024-03-09 ENCOUNTER — Ambulatory Visit: Payer: No Typology Code available for payment source | Admitting: Internal Medicine

## 2024-03-09 ENCOUNTER — Encounter: Payer: Self-pay | Admitting: Internal Medicine

## 2024-03-09 VITALS — BP 142/90 | HR 69 | Temp 97.8°F | Ht 66.0 in | Wt 181.4 lb

## 2024-03-09 DIAGNOSIS — I10 Essential (primary) hypertension: Secondary | ICD-10-CM

## 2024-03-09 DIAGNOSIS — Z8249 Family history of ischemic heart disease and other diseases of the circulatory system: Secondary | ICD-10-CM

## 2024-03-09 DIAGNOSIS — R7309 Other abnormal glucose: Secondary | ICD-10-CM | POA: Diagnosis not present

## 2024-03-09 MED ORDER — AMLODIPINE BESYLATE 2.5 MG PO TABS: 2.5000 mg | ORAL_TABLET | Freq: Every day | ORAL | 11 refills | Status: DC

## 2024-03-09 NOTE — Progress Notes (Signed)
 I,Victoria T Deloria Lair, CMA,acting as a Neurosurgeon for Gwynneth Aliment, MD.,have documented all relevant documentation on the behalf of Gwynneth Aliment, MD,as directed by  Gwynneth Aliment, MD while in the presence of Gwynneth Aliment, MD.  Subjective:  Patient ID: Sheryl [name] , female    DOB: 07/04/1966 , 58 y.o.   MRN: [id number]  No chief complaint on file.   HPI  Patient presents today for elevated blood pressure follow up. She is not currently taking any prescribed medications. Denies headache, chest pain & sob. She is not feeling stressed. She has no specific questions or concerns.      Past Medical History:  Diagnosis Date   Anxiety    is in therapy   Dysmenorrhea    Endocervical polyp    Fibroids    GERD (gastroesophageal reflux disease)    diet controlled   H/O varicella    History of chicken pox    History of measles    History of measles, mumps, or rubella    History of mumps    Menorrhagia    Nocturia    Unstable bladder    Urinary frequency    Yeast infection      Family History  Problem Relation Age of Onset   Diverticulitis Mother    Diabetes Father    Diabetes Paternal Grandfather    Sleep apnea Neg Hx      Current Outpatient Medications:    amLODipine (NORVASC) 2.5 MG tablet, Take 1 tablet (2.5 mg total) by mouth daily., Disp: 30 tablet, Rfl: 11   Ascorbic Acid (VITAMIN C PO), Take by mouth., Disp: , Rfl:    benzonatate (TESSALON PERLES) 100 MG capsule, Take 1 capsule (100 mg total) by mouth 3 (three) times daily as needed for cough., Disp: 30 capsule, Rfl: 1   Biotin w/ Vitamins C & E (HAIR/SKIN/NAILS PO), Take by mouth., Disp: , Rfl:    Calcium Carbonate (CALCIUM 600 PO), Take by mouth. 1 daily, Disp: , Rfl:    Cholecalciferol (VITAMIN D3 PO), 5,000 Units daily. , Disp: , Rfl:    Cyanocobalamin (B-12 PO), Take by mouth., Disp: , Rfl:    EPINEPHrine (EPIPEN 2-PAK) 0.3 mg/0.3 mL IJ SOAJ injection, EpiPen 2-Pak 0.3 mg/0.3 mL injection, auto-injector,  Disp: 1 each, Rfl: 2   fluticasone (FLONASE) 50 MCG/ACT nasal spray, 2 sprays via each nostril daily, Disp: 48 g, Rfl: 2   ibuprofen (ADVIL,MOTRIN) 600 MG tablet, 1 po pc q 6 hours x 5 days then prn-pain, Disp: 30 tablet, Rfl: 1   levocetirizine (XYZAL) 5 MG tablet, TAKE 1 TABLET BY MOUTH EVERY DAY IN THE EVENING, Disp: 90 tablet, Rfl: 1   montelukast (SINGULAIR) 10 MG tablet, TAKE 1 TABLET BY MOUTH EVERY DAY, Disp: 90 tablet, Rfl: 1   Multiple Vitamins-Minerals (MULTIVITAMIN ADULTS PO), multivitamin, Disp: , Rfl:    Probiotic Product (RESTORA) CAPS, TAKE 1 CAPSULE BY MOUTH EVERY DAY, Disp: 30 capsule, Rfl: 5   traZODone (DESYREL) 50 MG tablet, Take 1 tablet (50 mg total) by mouth at bedtime., Disp: 30 tablet, Rfl: 2   Allergies  Allergen Reactions   Almond (Diagnostic) Anaphylaxis   Cat Dander    Milk-Related Compounds      Review of Systems  Constitutional: Negative.   Respiratory: Negative.    Cardiovascular: Negative.   Gastrointestinal: Negative.   Neurological: Negative.   Psychiatric/Behavioral: Negative.       Today's Vitals   03/09/24 0903 03/09/24 0918  BP: Marland Kitchen)  140/90 (!) 142/90  Pulse: 69   Temp: 97.8 F (36.6 C)   SpO2: 98%   Weight: 181 lb 6.4 oz (82.3 kg)   Height: 5\' 6"  (1.676 m)    Body mass index is 29.28 kg/m.  Wt Readings from Last 3 Encounters:  03/09/24 181 lb 6.4 oz (82.3 kg)  02/12/24 178 lb (80.7 kg)  12/31/23 178 lb 9.6 oz (81 kg)    BP Readings from Last 3 Encounters:  03/09/24 (!) 142/90  02/12/24 (!) 140/80  12/31/23 (!) 130/90     Objective:  Physical Exam Vitals and nursing note reviewed.  Constitutional:      Appearance: Normal appearance. She is obese.  HENT:     Head: Normocephalic and atraumatic.  Eyes:     Extraocular Movements: Extraocular movements intact.  Cardiovascular:     Rate and Rhythm: Normal rate and regular rhythm.     Heart sounds: Normal heart sounds.  Pulmonary:     Effort: Pulmonary effort is normal.      Breath sounds: Normal breath sounds.  Musculoskeletal:     Cervical back: Normal range of motion.  Skin:    General: Skin is warm.  Neurological:     General: No focal deficit present.     Mental Status: She is alert.  Psychiatric:        Mood and Affect: Mood normal.        Behavior: Behavior normal.         Assessment And Plan:  Essential hypertension, benign Assessment & Plan: Newly diagnosed, previous BP readings reviewed. I will start her on amlodipine 2.5mg , taken at dinner. Possible side effects d/w patient. She agrees to rto in 4 weeks for re-evaluation. She is encouraged to follow DASH eating plan.   Orders: -     CMP14+EGFR  Abnormal glucose Assessment & Plan: Previous labs reviewed, her A1c has been elevated in the past. I will check an A1c today. Reminded to avoid refined sugars including sugary drinks/foods and processed meats including bacon, sausages and deli meats.    Orders: -     Hemoglobin A1c  Family history of heart disease -     Lipoprotein A (LPA)  Other orders -     amLODIPine Besylate; Take 1 tablet (2.5 mg total) by mouth daily.  Dispense: 30 tablet; Refill: 11     Return for 4 months bpc..  Patient was given opportunity to ask questions. Patient verbalized understanding of the plan and was able to repeat key elements of the plan. All questions were answered to their satisfaction.    I, Gwynneth Aliment, MD, have reviewed all documentation for this visit. The documentation on 03/09/24 for the exam, diagnosis, procedures, and orders are all accurate and complete.   IF YOU HAVE BEEN REFERRED TO A SPECIALIST, IT MAY TAKE 1-2 WEEKS TO SCHEDULE/PROCESS THE REFERRAL. IF YOU HAVE NOT HEARD FROM US/SPECIALIST IN TWO WEEKS, PLEASE GIVE [place] A CALL AT [phone].   THE PATIENT IS ENCOURAGED TO PRACTICE SOCIAL DISTANCING DUE TO THE COVID-19 PANDEMIC.

## 2024-03-09 NOTE — Patient Instructions (Signed)

## 2024-03-09 NOTE — Assessment & Plan Note (Signed)
 Previous labs reviewed, her A1c has been elevated in the past. I will check an A1c today. Reminded to avoid refined sugars including sugary drinks/foods and processed meats including bacon, sausages and deli meats.

## 2024-03-09 NOTE — Assessment & Plan Note (Addendum)
 Newly diagnosed, previous BP readings reviewed. I will start her on amlodipine 2.5mg , taken at dinner. Possible side effects d/w patient. She agrees to rto in 4 weeks for re-evaluation. She is encouraged to follow DASH eating plan.

## 2024-03-10 LAB — CMP14+EGFR
ALT: 54 IU/L — ABNORMAL HIGH (ref 0–32)
AST: 41 IU/L — ABNORMAL HIGH (ref 0–40)
Albumin: 4.4 g/dL (ref 3.8–4.9)
Alkaline Phosphatase: 219 IU/L — ABNORMAL HIGH (ref 44–121)
BUN/Creatinine Ratio: 13 (ref 9–23)
BUN: 10 mg/dL (ref 6–24)
Bilirubin Total: 0.7 mg/dL (ref 0.0–1.2)
CO2: 25 mmol/L (ref 20–29)
Calcium: 9.6 mg/dL (ref 8.7–10.2)
Chloride: 102 mmol/L (ref 96–106)
Creatinine, Ser: 0.75 mg/dL (ref 0.57–1.00)
Globulin, Total: 2.7 g/dL (ref 1.5–4.5)
Glucose: 98 mg/dL (ref 70–99)
Potassium: 4.2 mmol/L (ref 3.5–5.2)
Sodium: 144 mmol/L (ref 134–144)
Total Protein: 7.1 g/dL (ref 6.0–8.5)
eGFR: 92 mL/min/{1.73_m2} (ref >59)

## 2024-03-10 LAB — HEMOGLOBIN A1C
Est. average glucose Bld gHb Est-mCnc: 123 mg/dL
Hgb A1c MFr Bld: 5.9 % — ABNORMAL HIGH (ref 4.8–5.6)

## 2024-03-10 LAB — LIPOPROTEIN A (LPA): Lipoprotein (a): 177.8 nmol/L — ABNORMAL HIGH (ref <75.0)

## 2024-03-11 ENCOUNTER — Encounter: Payer: Self-pay | Admitting: Internal Medicine

## 2024-03-12 ENCOUNTER — Other Ambulatory Visit: Payer: Self-pay | Admitting: Internal Medicine

## 2024-03-12 DIAGNOSIS — Z8249 Family history of ischemic heart disease and other diseases of the circulatory system: Secondary | ICD-10-CM

## 2024-03-12 DIAGNOSIS — E7841 Elevated Lipoprotein(a): Secondary | ICD-10-CM

## 2024-04-07 ENCOUNTER — Encounter: Payer: Self-pay | Admitting: Internal Medicine

## 2024-04-14 ENCOUNTER — Ambulatory Visit: Admitting: Internal Medicine

## 2024-04-14 VITALS — BP 118/80 | HR 88 | Temp 98.8°F | Ht 66.0 in | Wt 181.8 lb

## 2024-04-14 DIAGNOSIS — Z8249 Family history of ischemic heart disease and other diseases of the circulatory system: Secondary | ICD-10-CM | POA: Insufficient documentation

## 2024-04-14 DIAGNOSIS — E7841 Elevated Lipoprotein(a): Secondary | ICD-10-CM | POA: Insufficient documentation

## 2024-04-14 DIAGNOSIS — I10 Essential (primary) hypertension: Secondary | ICD-10-CM | POA: Diagnosis not present

## 2024-04-14 MED ORDER — EPINEPHRINE 0.3 MG/0.3ML IJ SOAJ: INTRAMUSCULAR | 2 refills | Status: AC

## 2024-04-14 MED ORDER — BENZONATATE 100 MG PO CAPS: 100.0000 mg | ORAL_CAPSULE | Freq: Three times a day (TID) | ORAL | 1 refills | Status: DC | PRN

## 2024-04-14 MED ORDER — MONTELUKAST SODIUM 10 MG PO TABS
10.0000 mg | ORAL_TABLET | Freq: Every day | ORAL | 1 refills | Status: DC
Start: 2024-04-14 — End: 2024-06-15

## 2024-04-14 NOTE — Progress Notes (Signed)
 I,Sheryl [name], CMA,acting as a Neurosurgeon for Sheryl Dung, MD.,have documented all relevant documentation on the behalf of Sheryl Dung, MD,as directed by  Sheryl Dung, MD while in the presence of Sheryl Dung, MD.  Subjective:  Patient ID: Sheryl [name] , female    DOB: [birth year] , 58 y.o.   MRN: [id number]  Chief Complaint  Patient presents with   Hypertension    Patient presents today for bpc. He reports compliance with medications. Denies headache, chest pain & sob.    HPI Discussed the use of AI scribe software for clinical note transcription with the patient, who gave verbal consent to proceed.  History of Present Illness Sheryl [name] "Sheryl [name]" is a 58 year old female with hypertension who presents for a blood pressure check.  She takes amlodipine  2.5 mg at dinner time, which has improved her blood pressure. Initially, she experienced dizziness and lightheadedness, particularly in the first two weeks of starting the medication, but these symptoms have since resolved.  Last Wednesday, after taking her medication with dinner around 6 or 7 PM, she experienced a headache while running errands around 8 PM. [facility], which was 145/90 mmHg, and upon rechecking, it was 149/90 mmHg. She was not stressed at the time and had eaten a healthy dinner consisting of baked food and vegetables seasoned with olive oil, dash, sesame seed, and pepper.  No current dizziness or lightheadedness. Her brother had a heart attack at age 14.   Hypertension This is a new problem. The current episode started 1 to 4 weeks ago. The problem has been gradually improving since onset. Past treatments include calcium channel blockers.     Past Medical History:  Diagnosis Date   Anxiety    is in therapy   Dysmenorrhea    Endocervical polyp    Fibroids    GERD (gastroesophageal reflux disease)    diet controlled   H/O varicella    History of chicken pox     History of measles    History of measles, mumps, or rubella    History of mumps    Menorrhagia    Nocturia    Unstable bladder    Urinary frequency    Yeast infection      Family History  Problem Relation Age of Onset   Diverticulitis Mother    Diabetes Father    Diabetes Paternal Grandfather    Sleep apnea Neg Hx      Current Outpatient Medications:    amLODipine  (NORVASC ) 2.5 MG tablet, Take 1 tablet (2.5 mg total) by mouth daily., Disp: 30 tablet, Rfl: 11   Ascorbic Acid (VITAMIN C PO), Take by mouth., Disp: , Rfl:    Biotin w/ Vitamins C & E (HAIR/SKIN/NAILS PO), Take by mouth., Disp: , Rfl:    Calcium Carbonate (CALCIUM 600 PO), Take by mouth. 1 daily, Disp: , Rfl:    Cholecalciferol (VITAMIN D3 PO), 5,000 Units daily. , Disp: , Rfl:    Cyanocobalamin (B-12 PO), Take by mouth., Disp: , Rfl:    fluticasone  (FLONASE ) 50 MCG/ACT nasal spray, 2 sprays via each nostril daily, Disp: 48 g, Rfl: 2   ibuprofen  (ADVIL ,MOTRIN ) 600 MG tablet, 1 po pc q 6 hours x 5 days then prn-pain, Disp: 30 tablet, Rfl: 1   levocetirizine (XYZAL ) 5 MG tablet, TAKE 1 TABLET BY MOUTH EVERY DAY IN THE EVENING, Disp: 90 tablet, Rfl: 1   Multiple Vitamins-Minerals (MULTIVITAMIN ADULTS PO), multivitamin,  Disp: , Rfl:    Probiotic Product (RESTORA) CAPS, TAKE 1 CAPSULE BY MOUTH EVERY DAY, Disp: 30 capsule, Rfl: 5   ursodiol (ACTIGALL) 300 MG capsule, Take 600 mg by mouth., Disp: , Rfl:    benzonatate  (TESSALON  PERLES) 100 MG capsule, Take 1 capsule (100 mg total) by mouth 3 (three) times daily as needed for cough., Disp: 30 capsule, Rfl: 1   EPINEPHrine  (EPIPEN  2-PAK) 0.3 mg/0.3 mL IJ SOAJ injection, EpiPen  2-Pak 0.3 mg/0.3 mL injection, auto-injector, Disp: 1 each, Rfl: 2   montelukast  (SINGULAIR ) 10 MG tablet, Take 1 tablet (10 mg total) by mouth daily., Disp: 90 tablet, Rfl: 1   Allergies  Allergen Reactions   Almond (Diagnostic) Anaphylaxis   Cat Dander    Milk-Related Compounds      Review of  Systems  Constitutional: Negative.   Respiratory: Negative.    Cardiovascular: Negative.   Gastrointestinal: Negative.   Neurological: Negative.   Psychiatric/Behavioral: Negative.       Today's Vitals   04/14/24 1018  BP: 118/80  Pulse: 88  Temp: 98.8 F (37.1 C)  SpO2: 98%  Weight: 181 lb 12.8 oz (82.5 kg)  Height: 5\' 6"  (1.676 m)   Body mass index is 29.34 kg/m.  Wt Readings from Last 3 Encounters:  04/14/24 181 lb 12.8 oz (82.5 kg)  03/09/24 181 lb 6.4 oz (82.3 kg)  02/12/24 178 lb (80.7 kg)    BP Readings from Last 3 Encounters:  04/14/24 118/80  03/09/24 (!) 142/90  02/12/24 (!) 140/80     Objective:  Physical Exam Vitals and nursing note reviewed.  Constitutional:      Appearance: Normal appearance.  HENT:     Head: Normocephalic and atraumatic.  Eyes:     Extraocular Movements: Extraocular movements intact.  Cardiovascular:     Rate and Rhythm: Normal rate and regular rhythm.     Heart sounds: Normal heart sounds.  Pulmonary:     Effort: Pulmonary effort is normal.     Breath sounds: Normal breath sounds.  Musculoskeletal:     Cervical back: Normal range of motion.  Skin:    General: Skin is warm.  Neurological:     General: No focal deficit present.     Mental Status: She is alert.  Psychiatric:        Mood and Affect: Mood normal.        Behavior: Behavior normal.       Assessment And Plan:  Essential hypertension, benign Assessment & Plan: Blood pressure improved with amlodipine . Isolated elevated reading likely stress or dietary related. Initial side effects resolved. Advised against wrist monitors and certain OTC medications. - Continue amlodipine  2.5 mg at dinner. - Obtain upper arm blood pressure monitor for home use. - Educate on avoiding OTC medications that elevate blood pressure. - Monitor dietary factors affecting blood pressure.   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  Discussed potential cholesterol medication if future calcium score is elevated.  Orders: -     Ambulatory referral to Cardiology -     CT CARDIAC SCORING (SELF PAY ONLY); Future  Family history of heart disease Assessment & Plan: Family history increases cardiovascular risk. Referral to cardiologist for preventive evaluation due to family history and elevated lipoprotein(a). - Refer to Dr. Babs Less, cardiologist at [facility], for preventive evaluation.  Orders: -     Ambulatory referral to Cardiology -     CT CARDIAC SCORING (SELF PAY ONLY); Future  Other orders -  Montelukast  Sodium; Take 1 tablet (10 mg total) by mouth daily.  Dispense: 90 tablet; Refill: 1 -     EPINEPHrine ; EpiPen  2-Pak 0.3 mg/0.3 mL injection, auto-injector  Dispense: 1 each; Refill: 2 -     Benzonatate ; Take 1 capsule (100 mg total) by mouth 3 (three) times daily as needed for cough.  Dispense: 30 capsule; Refill: 1    Return if symptoms worsen or fail to improve.  Patient was given opportunity to ask questions. Patient verbalized understanding of the plan and was able to repeat key elements of the plan. All questions were answered to their satisfaction.    I, Sheryl Dung, MD, have reviewed all documentation for this visit. The documentation on 04/14/24 for the exam, diagnosis, procedures, and orders are all accurate and complete.   IF YOU HAVE BEEN REFERRED TO A SPECIALIST, IT MAY TAKE 1-2 WEEKS TO SCHEDULE/PROCESS THE REFERRAL. IF YOU HAVE NOT HEARD FROM US /SPECIALIST IN TWO WEEKS, PLEASE GIVE US  A CALL AT [phone].   THE PATIENT IS ENCOURAGED TO PRACTICE SOCIAL DISTANCING DUE TO THE COVID-19 PANDEMIC.

## 2024-04-14 NOTE — Assessment & Plan Note (Signed)
 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 Discussed potential cholesterol medication if future calcium score is elevated.

## 2024-04-14 NOTE — Assessment & Plan Note (Signed)
 Family history increases cardiovascular risk. Referral to cardiologist for preventive evaluation due to family history and elevated lipoprotein(a). - Refer to Dr. Babs Less, cardiologist at [facility], for preventive evaluation.

## 2024-04-14 NOTE — Patient Instructions (Signed)
 Hypertension, Adult Hypertension is another name for high blood pressure. High blood pressure forces your heart to work harder to pump blood. This can cause problems over time. There are two numbers in a blood pressure reading. There is a top number (systolic) over a bottom number (diastolic). It is best to have a blood pressure that is below 120/80. What are the causes? The cause of this condition is not known. Some other conditions can lead to high blood pressure. What increases the risk? Some lifestyle factors can make you more likely to develop high blood pressure: Smoking. Not getting enough exercise or physical activity. Being overweight. Having too much fat, sugar, calories, or salt (sodium) in your diet. Drinking too much alcohol. Other risk factors include: Having any of these conditions: Heart disease. Diabetes. High cholesterol. Kidney disease. Obstructive sleep apnea. Having a family history of high blood pressure and high cholesterol. Age. The risk increases with age. Stress. What are the signs or symptoms? High blood pressure may not cause symptoms. Very high blood pressure (hypertensive crisis) may cause: Headache. Fast or uneven heartbeats (palpitations). Shortness of breath. Nosebleed. Vomiting or feeling like you may vomit (nauseous). Changes in how you see. Very bad chest pain. Feeling dizzy. Seizures. How is this treated? This condition is treated by making healthy lifestyle changes, such as: Eating healthy foods. Exercising more. Drinking less alcohol. Your doctor may prescribe medicine if lifestyle changes do not help enough and if: Your top number is above 130. Your bottom number is above 80. Your personal target blood pressure may vary. Follow these instructions at home: Eating and drinking  If told, follow the DASH eating plan. To follow this plan: Fill one half of your plate at each meal with fruits and vegetables. Fill one fourth of your plate  at each meal with whole grains. Whole grains include whole-wheat pasta, brown rice, and whole-grain bread. Eat or drink low-fat dairy products, such as skim milk or low-fat yogurt. Fill one fourth of your plate at each meal with low-fat (lean) proteins. Low-fat proteins include fish, chicken without skin, eggs, beans, and tofu. Avoid fatty meat, cured and processed meat, or chicken with skin. Avoid pre-made or processed food. Limit the amount of salt in your diet to less than 1,500 mg each day. Do not drink alcohol if: Your doctor tells you not to drink. You are pregnant, may be pregnant, or are planning to become pregnant. If you drink alcohol: Limit how much you have to: 0-1 drink a day for women. 0-2 drinks a day for men. Know how much alcohol is in your drink. In the [place] one drink equals one 12 oz bottle of beer (355 mL), one 5 oz glass of wine (148 mL), or one 1 oz glass of hard liquor (44 mL). Lifestyle  Work with your doctor to stay at a healthy weight or to lose weight. Ask your doctor what the best weight is for you. Get at least 30 minutes of exercise that causes your heart to beat faster (aerobic exercise) most days of the week. This may include walking, swimming, or biking. Get at least 30 minutes of exercise that strengthens your muscles (resistance exercise) at least 3 days a week. This may include lifting weights or doing Pilates. Do not smoke or use any products that contain nicotine or tobacco. If you need help quitting, ask your doctor. Check your blood pressure at home as told by your doctor. Keep all follow-up visits. Medicines Take over-the-counter and prescription medicines  only as told by your doctor. Follow directions carefully. Do not skip doses of blood pressure medicine. The medicine does not work as well if you skip doses. Skipping doses also puts you at risk for problems. Ask your doctor about side effects or reactions to medicines that you should watch  for. Contact a doctor if: You think you are having a reaction to the medicine you are taking. You have headaches that keep coming back. You feel dizzy. You have swelling in your ankles. You have trouble with your vision. Get help right away if: You get a very bad headache. You start to feel mixed up (confused). You feel weak or numb. You feel faint. You have very bad pain in your: Chest. Belly (abdomen). You vomit more than once. You have trouble breathing. These symptoms may be an emergency. Get help right away. Call 911. Do not wait to see if the symptoms will go away. Do not drive yourself to the hospital. Summary Hypertension is another name for high blood pressure. High blood pressure forces your heart to work harder to pump blood. For most people, a normal blood pressure is less than 120/80. Making healthy choices can help lower blood pressure. If your blood pressure does not get lower with healthy choices, you may need to take medicine. This information is not intended to replace advice given to you by your health care provider. Make sure you discuss any questions you have with your health care provider. Document Revised: 09/21/2021 Document Reviewed: 09/21/2021 Elsevier Patient Education  2024 [employer].

## 2024-04-14 NOTE — Assessment & Plan Note (Signed)
 Blood pressure improved with amlodipine . Isolated elevated reading likely stress or dietary related. Initial side effects resolved. Advised against wrist monitors and certain OTC medications. - Continue amlodipine  2.5 mg at dinner. - Obtain upper arm blood pressure monitor for home use. - Educate on avoiding OTC medications that elevate blood pressure. - Monitor dietary factors affecting blood pressure.

## 2024-04-23 ENCOUNTER — Other Ambulatory Visit

## 2024-04-30 ENCOUNTER — Ambulatory Visit
Admission: RE | Admit: 2024-04-30 | Discharge: 2024-04-30 | Disposition: A | Discharge status: Home / Self Care | Payer: Self-pay | Source: Ambulatory Visit | Attending: Internal Medicine | Admitting: Internal Medicine

## 2024-04-30 DIAGNOSIS — Z8249 Family history of ischemic heart disease and other diseases of the circulatory system: Secondary | ICD-10-CM | POA: Insufficient documentation

## 2024-04-30 DIAGNOSIS — E7841 Elevated Lipoprotein(a): Secondary | ICD-10-CM | POA: Insufficient documentation

## 2024-05-02 ENCOUNTER — Ambulatory Visit: Payer: Self-pay | Admitting: Internal Medicine

## 2024-05-08 ENCOUNTER — Encounter: Payer: Self-pay | Admitting: Internal Medicine

## 2024-05-13 ENCOUNTER — Encounter: Payer: No Typology Code available for payment source | Admitting: Internal Medicine

## 2024-06-13 ENCOUNTER — Other Ambulatory Visit: Payer: Self-pay | Admitting: Internal Medicine

## 2024-06-26 ENCOUNTER — Other Ambulatory Visit: Payer: Self-pay | Admitting: Internal Medicine

## 2024-06-30 LAB — HM MAMMOGRAPHY

## 2024-07-02 ENCOUNTER — Encounter: Payer: Self-pay | Admitting: Internal Medicine

## 2024-07-14 ENCOUNTER — Ambulatory Visit: Admitting: Internal Medicine

## 2024-07-15 ENCOUNTER — Telehealth: Payer: Self-pay | Admitting: Internal Medicine

## 2024-07-15 MED ORDER — AMLODIPINE BESYLATE 2.5 MG PO TABS: 2.5000 mg | ORAL_TABLET | Freq: Every day | ORAL | 11 refills | Status: AC

## 2024-07-15 MED ORDER — BENZONATATE 100 MG PO CAPS: 100.0000 mg | ORAL_CAPSULE | Freq: Three times a day (TID) | ORAL | 1 refills | Status: AC | PRN

## 2024-07-15 NOTE — Telephone Encounter (Signed)
 Copied from CRM [id number]. Topic: Clinical - Medication Refill >> Jul 15, 2024  3:41 PM Charlet HERO wrote: Medication: amLODipine  (NORVASC ) 2.5 MG tablet benzonatate  (TESSALON  PERLES) 100 MG capsule  Has the patient contacted their pharmacy? No Needs bc insurance is about to expire she is retiring and needs bc she will be without for 3 months  This is the patient's preferred pharmacy:  [id number] - [place], [place] - [phone] [facility] AT [facility] [address] [facility]) [place] [address] Phone: [phone] Fax: [phone]  Is this the correct pharmacy for this prescription? Yes If no, delete pharmacy and type the correct one.   Has the prescription been filled recently? Yes  Is the patient out of the medication? Yes  Has the patient been seen for an appointment in the last year OR does the patient have an upcoming appointment? Yes  Can we respond through MyChart? Yes  Agent: Please be advised that Rx refills may take up to 3 business days. We ask that you follow-up with your pharmacy.

## 2024-09-03 ENCOUNTER — Ambulatory Visit: Payer: Self-pay | Admitting: Internal Medicine

## 2024-09-03 ENCOUNTER — Encounter: Payer: Self-pay | Admitting: Internal Medicine

## 2024-09-03 VITALS — BP 110/78 | HR 83 | Temp 98.1°F | Ht 66.0 in | Wt 178.0 lb

## 2024-09-03 DIAGNOSIS — M25562 Pain in left knee: Secondary | ICD-10-CM

## 2024-09-03 DIAGNOSIS — I10 Essential (primary) hypertension: Secondary | ICD-10-CM

## 2024-09-03 DIAGNOSIS — M25561 Pain in right knee: Secondary | ICD-10-CM

## 2024-09-03 DIAGNOSIS — M778 Other enthesopathies, not elsewhere classified: Secondary | ICD-10-CM

## 2024-09-03 DIAGNOSIS — G8929 Other chronic pain: Secondary | ICD-10-CM | POA: Diagnosis not present

## 2024-09-03 MED ORDER — DICLOFENAC SODIUM 1 % EX GEL: CUTANEOUS | 1 refills | Status: DC

## 2024-09-03 NOTE — Patient Instructions (Signed)
 Hypertension, Adult Hypertension is another name for high blood pressure. High blood pressure forces your heart to work harder to pump blood. This can cause problems over time. There are two numbers in a blood pressure reading. There is a top number (systolic) over a bottom number (diastolic). It is best to have a blood pressure that is below 120/80. What are the causes? The cause of this condition is not known. Some other conditions can lead to high blood pressure. What increases the risk? Some lifestyle factors can make you more likely to develop high blood pressure: Smoking. Not getting enough exercise or physical activity. Being overweight. Having too much fat, sugar, calories, or salt (sodium) in your diet. Drinking too much alcohol. Other risk factors include: Having any of these conditions: Heart disease. Diabetes. High cholesterol. Kidney disease. Obstructive sleep apnea. Having a family history of high blood pressure and high cholesterol. Age. The risk increases with age. Stress. What are the signs or symptoms? High blood pressure may not cause symptoms. Very high blood pressure (hypertensive crisis) may cause: Headache. Fast or uneven heartbeats (palpitations). Shortness of breath. Nosebleed. Vomiting or feeling like you may vomit (nauseous). Changes in how you see. Very bad chest pain. Feeling dizzy. Seizures. How is this treated? This condition is treated by making healthy lifestyle changes, such as: Eating healthy foods. Exercising more. Drinking less alcohol. Your doctor may prescribe medicine if lifestyle changes do not help enough and if: Your top number is above 130. Your bottom number is above 80. Your personal target blood pressure may vary. Follow these instructions at home: Eating and drinking  If told, follow the DASH eating plan. To follow this plan: Fill one half of your plate at each meal with fruits and vegetables. Fill one fourth of your plate  at each meal with whole grains. Whole grains include whole-wheat pasta, brown rice, and whole-grain bread. Eat or drink low-fat dairy products, such as skim milk or low-fat yogurt. Fill one fourth of your plate at each meal with low-fat (lean) proteins. Low-fat proteins include fish, chicken without skin, eggs, beans, and tofu. Avoid fatty meat, cured and processed meat, or chicken with skin. Avoid pre-made or processed food. Limit the amount of salt in your diet to less than 1,500 mg each day. Do not drink alcohol if: Your doctor tells you not to drink. You are pregnant, may be pregnant, or are planning to become pregnant. If you drink alcohol: Limit how much you have to: 0-1 drink a day for women. 0-2 drinks a day for men. Know how much alcohol is in your drink. In the [place] one drink equals one 12 oz bottle of beer (355 mL), one 5 oz glass of wine (148 mL), or one 1 oz glass of hard liquor (44 mL). Lifestyle  Work with your doctor to stay at a healthy weight or to lose weight. Ask your doctor what the best weight is for you. Get at least 30 minutes of exercise that causes your heart to beat faster (aerobic exercise) most days of the week. This may include walking, swimming, or biking. Get at least 30 minutes of exercise that strengthens your muscles (resistance exercise) at least 3 days a week. This may include lifting weights or doing Pilates. Do not smoke or use any products that contain nicotine or tobacco. If you need help quitting, ask your doctor. Check your blood pressure at home as told by your doctor. Keep all follow-up visits. Medicines Take over-the-counter and prescription medicines  only as told by your doctor. Follow directions carefully. Do not skip doses of blood pressure medicine. The medicine does not work as well if you skip doses. Skipping doses also puts you at risk for problems. Ask your doctor about side effects or reactions to medicines that you should watch  for. Contact a doctor if: You think you are having a reaction to the medicine you are taking. You have headaches that keep coming back. You feel dizzy. You have swelling in your ankles. You have trouble with your vision. Get help right away if: You get a very bad headache. You start to feel mixed up (confused). You feel weak or numb. You feel faint. You have very bad pain in your: Chest. Belly (abdomen). You vomit more than once. You have trouble breathing. These symptoms may be an emergency. Get help right away. Call 911. Do not wait to see if the symptoms will go away. Do not drive yourself to the hospital. Summary Hypertension is another name for high blood pressure. High blood pressure forces your heart to work harder to pump blood. For most people, a normal blood pressure is less than 120/80. Making healthy choices can help lower blood pressure. If your blood pressure does not get lower with healthy choices, you may need to take medicine. This information is not intended to replace advice given to you by your health care provider. Make sure you discuss any questions you have with your health care provider. Document Revised: 09/21/2021 Document Reviewed: 09/21/2021 Elsevier Patient Education  2024 [employer].

## 2024-09-03 NOTE — Assessment & Plan Note (Signed)
 Chronic bilateral knee pain, left knee more severe, right knee buckling noted. - Recommend over-the-counter Voltaren  gel for left knee pain. - Refer to orthopedic specialist at [facility] for further evaluation of knee pain and buckling.

## 2024-09-03 NOTE — Progress Notes (Signed)
 I,Sheryl [name], CMA,acting as a Neurosurgeon for Sheryl LOISE Slocumb, MD.,have documented all relevant documentation on the behalf of Sheryl LOISE Slocumb, MD,as directed by  Sheryl LOISE Slocumb, MD while in the presence of Sheryl LOISE Slocumb, MD.  Subjective:  Patient ID: Sheryl [name] , female    DOB: Oct 11, 1966 , 58 y.o.   MRN: [id number]  Chief Complaint  Patient presents with   Hypertension    Patient presents today for bpc. She reports compliance with medications. Denies headache, chest pain & sob. While here today she experiences constant knee pain.  Sometimes her right knee buckles as if she will fall. She is not currently est with an orthopedic. She has not taken anything OTC. She also experiences a burning sensation in her left arm. From her elbow down to her arm.     HPI Discussed the use of AI scribe software for clinical note transcription with the patient, who gave verbal consent to proceed.  History of Present Illness Sheryl [name] is a 58 year old female with hypertension who presents for a blood pressure check.  She is currently taking amlodipine  2.5 mg daily for hypertension. She also takes Singulair  and Xyzal  regularly for allergic symptoms.  She experiences knee pain, particularly in the left knee, which has been persistent since May or June. The pain is described as a nightly discomfort that requires rubbing. She has not used any topical treatments like Voltaren  gel yet. Additionally, she reports episodes of buckling in the right knee, which occurred twice during a trip to [place] last month, causing her to nearly fall.  She recently retired and has not been exercising but plans to start light workouts as her chiropractor has cleared her for activity after a back injury. She has lost three pounds since her last visit.   Hypertension This is a new problem. The current episode started more than 1 month ago. The problem has been gradually improving since onset.  Pertinent negatives include no chest pain, headaches or palpitations. Past treatments include calcium channel blockers. The current treatment provides moderate improvement.     Past Medical History:  Diagnosis Date   Anxiety    is in therapy   Dysmenorrhea    Endocervical polyp    Fibroids    GERD (gastroesophageal reflux disease)    diet controlled   H/O varicella    History of chicken pox    History of measles    History of measles, mumps, or rubella    History of mumps    Menorrhagia    Nocturia    Unstable bladder    Urinary frequency    Yeast infection      Family History  Problem Relation Age of Onset   Diverticulitis Mother    Diabetes Father    Diabetes Paternal Grandfather    Sleep apnea Neg Hx      Current Outpatient Medications:    amLODipine  (NORVASC ) 2.5 MG tablet, Take 1 tablet (2.5 mg total) by mouth daily., Disp: 30 tablet, Rfl: 11   Ascorbic Acid (VITAMIN C PO), Take by mouth., Disp: , Rfl:    benzonatate  (TESSALON  PERLES) 100 MG capsule, Take 1 capsule (100 mg total) by mouth 3 (three) times daily as needed for cough., Disp: 30 capsule, Rfl: 1   Biotin w/ Vitamins C & E (HAIR/SKIN/NAILS PO), Take by mouth., Disp: , Rfl:    Calcium Carbonate (CALCIUM 600 PO), Take by mouth. 1 daily, Disp: , Rfl:    Cholecalciferol (  VITAMIN D3 PO), 5,000 Units daily. , Disp: , Rfl:    Cyanocobalamin (B-12 PO), Take by mouth., Disp: , Rfl:    diclofenac  Sodium (VOLTAREN ) 1 % GEL, Apply to affected area two to three times daily as needed, Disp: 150 g, Rfl: 1   EPINEPHrine  (EPIPEN  2-PAK) 0.3 mg/0.3 mL IJ SOAJ injection, EpiPen  2-Pak 0.3 mg/0.3 mL injection, auto-injector, Disp: 1 each, Rfl: 2   fluticasone  (FLONASE ) 50 MCG/ACT nasal spray, 2 sprays via each nostril daily, Disp: 48 g, Rfl: 2   ibuprofen  (ADVIL ,MOTRIN ) 600 MG tablet, 1 po pc q 6 hours x 5 days then prn-pain, Disp: 30 tablet, Rfl: 1   levocetirizine (XYZAL ) 5 MG tablet, TAKE 1 TABLET BY MOUTH EVERY DAY IN THE  EVENING, Disp: 90 tablet, Rfl: 1   montelukast  (SINGULAIR ) 10 MG tablet, TAKE 1 TABLET BY MOUTH EVERY DAY, Disp: 90 tablet, Rfl: 1   Multiple Vitamins-Minerals (MULTIVITAMIN ADULTS PO), multivitamin, Disp: , Rfl:    Probiotic Product (RESTORA) CAPS, TAKE 1 CAPSULE BY MOUTH EVERY DAY, Disp: 30 capsule, Rfl: 5   ursodiol (ACTIGALL) 300 MG capsule, Take 600 mg by mouth., Disp: , Rfl:    Allergies  Allergen Reactions   Almond (Diagnostic) Anaphylaxis   Cat Dander    Milk-Related Compounds      Review of Systems  Constitutional: Negative.   Respiratory: Negative.    Cardiovascular: Negative.  Negative for chest pain and palpitations.  Gastrointestinal: Negative.   Musculoskeletal:  Positive for arthralgias.  Neurological: Negative.  Negative for headaches.  Psychiatric/Behavioral: Negative.       Today's Vitals   09/03/24 1414  BP: 110/78  Pulse: 83  Temp: 98.1 F (36.7 C)  SpO2: 98%  Weight: 178 lb (80.7 kg)  Height: 5' 6 (1.676 m)   Body mass index is 28.73 kg/m.  Wt Readings from Last 3 Encounters:  09/03/24 178 lb (80.7 kg)  04/14/24 181 lb 12.8 oz (82.5 kg)  03/09/24 181 lb 6.4 oz (82.3 kg)     Objective:  Physical Exam Vitals and nursing note reviewed.  Constitutional:      Appearance: Normal appearance.  HENT:     Head: Normocephalic and atraumatic.  Cardiovascular:     Rate and Rhythm: Normal rate and regular rhythm.     Heart sounds: Normal heart sounds.  Pulmonary:     Effort: Pulmonary effort is normal.     Breath sounds: Normal breath sounds.  Musculoskeletal:     Comments: Knees neg crepitus Neg swelling  No overlying erythema left elbow.   Skin:    General: Skin is warm.  Neurological:     General: No focal deficit present.     Mental Status: She is alert.  Psychiatric:        Mood and Affect: Mood normal.        Behavior: Behavior normal.         Assessment And Plan:  Essential hypertension, benign Assessment & Plan: Blood pressure  improved with amlodipine .- Continue amlodipine  2.5 mg at dinner. - Follow up next month for physical exam.   Chronic pain of both knees Assessment & Plan: Chronic bilateral knee pain, left knee more severe, right knee buckling noted. - Recommend over-the-counter Voltaren  gel for left knee pain. - Refer to orthopedic specialist at [facility] for further evaluation of knee pain and buckling.  Orders: -     Ambulatory referral to Orthopedic Surgery  Left elbow tendinitis Assessment & Plan: She is advised to wear  elbow sleeve. May also consider applying Voltaren  gel to affected area twice daily.  - Icing the area may also be helpful.  - Discuss with Ortho if symptoms persist/worsen.   Orders: -     Diclofenac  Sodium; Apply to affected area two to three times daily as needed  Dispense: 150 g; Refill: 1   Return for 4 month dm f/u.SABRA  Patient was given opportunity to ask questions. Patient verbalized understanding of the plan and was able to repeat key elements of the plan. All questions were answered to their satisfaction.   I, Sheryl LOISE Slocumb, MD, have reviewed all documentation for this visit. The documentation on 09/03/24 for the exam, diagnosis, procedures, and orders are all accurate and complete.   IF YOU HAVE BEEN REFERRED TO A SPECIALIST, IT MAY TAKE 1-2 WEEKS TO SCHEDULE/PROCESS THE REFERRAL. IF YOU HAVE NOT HEARD FROM US /SPECIALIST IN TWO WEEKS, PLEASE GIVE US  A CALL AT [phone].   THE PATIENT IS ENCOURAGED TO PRACTICE SOCIAL DISTANCING DUE TO THE COVID-19 PANDEMIC.

## 2024-09-03 NOTE — Assessment & Plan Note (Signed)
 She is advised to wear elbow sleeve. May also consider applying Voltaren  gel to affected area twice daily.  - Icing the area may also be helpful.  - Discuss with Ortho if symptoms persist/worsen.

## 2024-09-03 NOTE — Assessment & Plan Note (Signed)
 Blood pressure improved with amlodipine .- Continue amlodipine  2.5 mg at dinner. - Follow up next month for physical exam.

## 2024-10-05 ENCOUNTER — Encounter: Payer: Self-pay | Admitting: Internal Medicine

## 2024-10-05 ENCOUNTER — Ambulatory Visit: Admitting: Internal Medicine

## 2024-10-05 ENCOUNTER — Ambulatory Visit: Payer: Self-pay | Admitting: Internal Medicine

## 2024-10-05 VITALS — BP 110/80 | HR 72 | Temp 98.1°F | Ht 66.0 in | Wt 180.0 lb

## 2024-10-05 DIAGNOSIS — H6121 Impacted cerumen, right ear: Secondary | ICD-10-CM

## 2024-10-05 DIAGNOSIS — L989 Disorder of the skin and subcutaneous tissue, unspecified: Secondary | ICD-10-CM | POA: Insufficient documentation

## 2024-10-05 DIAGNOSIS — R7309 Other abnormal glucose: Secondary | ICD-10-CM

## 2024-10-05 DIAGNOSIS — I10 Essential (primary) hypertension: Secondary | ICD-10-CM

## 2024-10-05 DIAGNOSIS — Z0000 Encounter for general adult medical examination without abnormal findings: Secondary | ICD-10-CM | POA: Diagnosis not present

## 2024-10-05 LAB — POCT URINALYSIS DIP (CLINITEK)
Bilirubin, UA: NEGATIVE
Blood, UA: NEGATIVE
Glucose, UA: NEGATIVE mg/dL
Ketones, POC UA: NEGATIVE mg/dL
Nitrite, UA: NEGATIVE
POC PROTEIN,UA: 100 — AB
Spec Grav, UA: 1.015 (ref 1.010–1.025)
Urobilinogen, UA: 1 U/dL
pH, UA: 7.5 (ref 5.0–8.0)

## 2024-10-05 NOTE — Progress Notes (Signed)
 I,Sheryl [name], CMA,acting as a Neurosurgeon for Sheryl LOISE Slocumb, MD.,have documented all relevant documentation on the behalf of Sheryl LOISE Slocumb, MD,as directed by  Sheryl LOISE Slocumb, MD while in the presence of Sheryl LOISE Slocumb, MD.  Subjective:    Patient ID: Sheryl [name] , female    DOB: December 23, 1965 , 58 y.o.   MRN: [id number]  Chief Complaint  Patient presents with   Annual Exam    She is here today for a full physical exam. She is followed by Dr. Armond for her GYN exams. Patient does not have any concerns or questions at this time.   Hypertension    HPI Discussed the use of AI scribe software for clinical note transcription with the patient, who gave verbal consent to proceed.  History of Present Illness Sheryl [name] is a 58 year old female who presents for an annual physical exam and blood pressure check.  She has a longstanding lesion on her right lower abdomen, near the umbilicus, which she scratched once causing it to bleed. She wants to have it evaluated by a dermatologist.  She experienced sciatica following an acupuncture session, which she believes was due to the position she was in during the procedure. She discussed her sciatica with her acupuncturist in [place], who suggested it was unlikely to be caused by the acupuncture needles themselves, but possibly related to her positioning during the procedure.  Her current medications include amlodipine  for blood pressure, Xyzal , Singulair , Actonel, and ursodiol. She recently started Actonel and ursodiol and is monitoring for any stomach irritation.  She has not been exercising regularly due to a busy schedule but plans to resume soon. She is considering travel to [place] in December, depending on her travel companion's availability.   Hypertension This is a new problem. The current episode started more than 1 month ago. The problem has been gradually improving since onset. Pertinent negatives include no anxiety,  blurred vision, orthopnea or palpitations. Past treatments include calcium channel blockers and lifestyle changes. The current treatment provides moderate improvement.     Past Medical History:  Diagnosis Date   Anxiety    is in therapy   Dysmenorrhea    Endocervical polyp    Fibroids    GERD (gastroesophageal reflux disease)    diet controlled   H/O varicella    History of chicken pox    History of measles    History of measles, mumps, or rubella    History of mumps    Menorrhagia    Nocturia    Unstable bladder    Urinary frequency    Yeast infection      Family History  Problem Relation Age of Onset   Diverticulitis Mother    Diabetes Father    Diabetes Paternal Grandfather    Sleep apnea Neg Hx      Current Outpatient Medications:    amLODipine  (NORVASC ) 2.5 MG tablet, Take 1 tablet (2.5 mg total) by mouth daily., Disp: 30 tablet, Rfl: 11   Ascorbic Acid (VITAMIN C PO), Take by mouth., Disp: , Rfl:    benzonatate  (TESSALON  PERLES) 100 MG capsule, Take 1 capsule (100 mg total) by mouth 3 (three) times daily as needed for cough., Disp: 30 capsule, Rfl: 1   Biotin w/ Vitamins C & E (HAIR/SKIN/NAILS PO), Take by mouth., Disp: , Rfl:    Calcium Carbonate (CALCIUM 600 PO), Take by mouth. 1 daily, Disp: , Rfl:    Cholecalciferol (VITAMIN D3 PO), 5,000 Units daily. ,  Disp: , Rfl:    Cyanocobalamin (B-12 PO), Take by mouth., Disp: , Rfl:    diclofenac  Sodium (VOLTAREN ) 1 % GEL, Apply to affected area two to three times daily as needed, Disp: 150 g, Rfl: 1   EPINEPHrine  (EPIPEN  2-PAK) 0.3 mg/0.3 mL IJ SOAJ injection, EpiPen  2-Pak 0.3 mg/0.3 mL injection, auto-injector, Disp: 1 each, Rfl: 2   fluticasone  (FLONASE ) 50 MCG/ACT nasal spray, 2 sprays via each nostril daily, Disp: 48 g, Rfl: 2   ibuprofen  (ADVIL ,MOTRIN ) 600 MG tablet, 1 po pc q 6 hours x 5 days then prn-pain, Disp: 30 tablet, Rfl: 1   levocetirizine (XYZAL ) 5 MG tablet, TAKE 1 TABLET BY MOUTH EVERY DAY IN THE  EVENING, Disp: 90 tablet, Rfl: 1   montelukast  (SINGULAIR ) 10 MG tablet, TAKE 1 TABLET BY MOUTH EVERY DAY, Disp: 90 tablet, Rfl: 1   Multiple Vitamins-Minerals (MULTIVITAMIN ADULTS PO), multivitamin, Disp: , Rfl:    Probiotic Product (RESTORA) CAPS, TAKE 1 CAPSULE BY MOUTH EVERY DAY, Disp: 30 capsule, Rfl: 5   ursodiol (ACTIGALL) 300 MG capsule, Take 600 mg by mouth., Disp: , Rfl:    Allergies  Allergen Reactions   Almond (Diagnostic) Anaphylaxis   Cat Dander    Milk-Related Compounds       The patient states she uses none for birth control. Patient's last menstrual period was 04/15/2018 (approximate).. Negative for Dysmenorrhea. Negative for: breast discharge, breast lump(s), breast pain and breast self exam. Associated symptoms include abnormal vaginal bleeding. Pertinent negatives include abnormal bleeding (hematology), anxiety, decreased libido, depression, difficulty falling sleep, dyspareunia, history of infertility, nocturia, sexual dysfunction, sleep disturbances, urinary incontinence, urinary urgency, vaginal discharge and vaginal itching. Diet regular.The patient states her exercise level is  intermittent.  . The patient's tobacco use is:  Social History   Tobacco Use  Smoking Status Never  Smokeless Tobacco Never  . She has been exposed to passive smoke. The patient's alcohol use is:  Social History   Substance and Sexual Activity  Alcohol Use Yes   Comment: 3 glasses of wine montly    Review of Systems  Constitutional: Negative.   HENT: Negative.    Eyes: Negative.  Negative for blurred vision.  Respiratory: Negative.    Cardiovascular: Negative.  Negative for palpitations and orthopnea.  Gastrointestinal: Negative.   Endocrine: Negative.   Genitourinary: Negative.   Musculoskeletal: Negative.   Skin: Negative.   Allergic/Immunologic: Negative.   Neurological: Negative.   Hematological: Negative.   Psychiatric/Behavioral: Negative.       Today's Vitals    10/05/24 1056  BP: 110/80  Pulse: 72  Temp: 98.1 F (36.7 C)  SpO2: 98%  Weight: 180 lb (81.6 kg)  Height: 5' 6 (1.676 m)   Body mass index is 29.05 kg/m.  Wt Readings from Last 3 Encounters:  10/05/24 180 lb (81.6 kg)  09/03/24 178 lb (80.7 kg)  04/14/24 181 lb 12.8 oz (82.5 kg)     Objective:  Physical Exam Vitals and nursing note reviewed.  Constitutional:      General: She is not in acute distress.    Appearance: Normal appearance. She is well-developed. She is obese.  HENT:     Head: Normocephalic and atraumatic.     Right Ear: Hearing, ear canal and external ear normal. There is impacted cerumen.     Left Ear: Hearing, tympanic membrane, ear canal and external ear normal. There is no impacted cerumen.     Nose: Nose normal.     Mouth/Throat:  Mouth: Mucous membranes are moist.     Pharynx: Oropharynx is clear.  Eyes:     General: Lids are normal.     Extraocular Movements: Extraocular movements intact.     Conjunctiva/sclera: Conjunctivae normal.     Pupils: Pupils are equal, round, and reactive to light.     Funduscopic exam:    Right eye: No papilledema.        Left eye: No papilledema.  Neck:     Thyroid: No thyroid mass.     Vascular: No carotid bruit.  Cardiovascular:     Rate and Rhythm: Normal rate and regular rhythm.     Pulses: Normal pulses.     Heart sounds: Normal heart sounds. No murmur heard. Pulmonary:     Effort: Pulmonary effort is normal.     Breath sounds: Normal breath sounds.  Chest:     Chest wall: No mass.  Breasts:    Tanner Score is 5.     Right: Normal. No mass or tenderness.     Left: Normal. No mass or tenderness.  Abdominal:     General: Abdomen is flat. Bowel sounds are normal. There is no distension.     Palpations: Abdomen is soft.     Tenderness: There is no abdominal tenderness.  Genitourinary:    Rectum: Guaiac result negative.     Comments: deferred Musculoskeletal:        General: No swelling. Normal range  of motion.     Cervical back: Full passive range of motion without pain, normal range of motion and neck supple.     Right lower leg: No edema.     Left lower leg: No edema.  Lymphadenopathy:     Upper Body:     Right upper body: No supraclavicular, axillary or pectoral adenopathy.     Left upper body: No supraclavicular, axillary or pectoral adenopathy.  Skin:    General: Skin is warm and dry.     Capillary Refill: Capillary refill takes less than 2 seconds.     Comments: Hyperpigmented, scaly stuck on plaque, located RLQ  Neurological:     General: No focal deficit present.     Mental Status: She is alert and oriented to person, place, and time.     Cranial Nerves: No cranial nerve deficit.     Sensory: No sensory deficit.  Psychiatric:        Mood and Affect: Mood normal.        Behavior: Behavior normal.        Thought Content: Thought content normal.        Judgment: Judgment normal.         Assessment And Plan:     Encounter for general adult medical examination w/o abnormal findings Assessment & Plan: A full exam was performed.  Importance of monthly self breast exams was discussed with the patient.  She is advised to get 30-45 minutes of regular exercise, no less than four to five days per week. Both weight-bearing and aerobic exercises are recommended.  She is advised to follow a healthy diet with at least six fruits/veggies per day, decrease intake of red meat and other saturated fats and to increase fish intake to twice weekly.  Meats/fish should not be fried -- baked, boiled or broiled is preferable. It is also important to cut back on your sugar intake.  Be sure to read labels - try to avoid anything with added sugar, high fructose corn syrup or other sweeteners.  If you must use a sweetener, you can try stevia or monkfruit.  It is also important to avoid artificially sweetened foods/beverages and diet drinks. Lastly, wear SPF 50 sunscreen on exposed skin and when in direct  sunlight for an extended period of time.  Be sure to avoid fast food restaurants and aim for at least 60 ounces of water daily.      Orders: -     CBC -     Lipid panel  Essential hypertension, benign Assessment & Plan: Chronic, fair control. Goal BP <130/80.  EKG performed, NSR w/ RBBB. Blood pressure improved with amlodipine .- Continue amlodipine  2.5 mg at dinner. - Follow low sodium diet.  - Follow up in six months.   Orders: -     POCT URINALYSIS DIP (CLINITEK) -     Microalbumin / creatinine urine ratio -     EKG 12-Lead  Skin lesion Assessment & Plan: Exam suggestive of seborrheic keratosis. Will refer her to Derm for further evaluation as requested.   Orders: -     Ambulatory referral to Dermatology  Right ear impacted cerumen Assessment & Plan: AFTER OBTAINING VERBAL CONSENT, RIGHT EAR WAS FLUSHED BY IRRIGATION. SHE TOLERATED PROCEDURE WELL WITHOUT ANY COMPLICATIONS. NO TM ABNORMALITIES WERE NOTED.   Orders: -     Ear Lavage  Abnormal glucose Assessment & Plan: Previous labs reviewed, her A1c has been elevated in the past. I will check an A1c today. Reminded to avoid refined sugars including sugary drinks/foods and processed meats including bacon, sausages and deli meats.    Orders: -     Hemoglobin A1c   Return for 1 YEAR HM, 6 MONTH BPC. Patient was given opportunity to ask questions. Patient verbalized understanding of the plan and was able to repeat key elements of the plan. All questions were answered to their satisfaction.   I, Sheryl LOISE Slocumb, MD, have reviewed all documentation for this visit. The documentation on 10/05/24 for the exam, diagnosis, procedures, and orders are all accurate and complete.

## 2024-10-05 NOTE — Assessment & Plan Note (Signed)
AFTER OBTAINING VERBAL CONSENT, RIGHT EAR WAS FLUSHED BY IRRIGATION. SHE TOLERATED PROCEDURE WELL WITHOUT ANY COMPLICATIONS. NO TM ABNORMALITIES WERE NOTED.

## 2024-10-05 NOTE — Patient Instructions (Signed)

## 2024-10-05 NOTE — Assessment & Plan Note (Signed)
 Previous labs reviewed, her A1c has been elevated in the past. I will check an A1c today. Reminded to avoid refined sugars including sugary drinks/foods and processed meats including bacon, sausages and deli meats.

## 2024-10-05 NOTE — Assessment & Plan Note (Addendum)
 Chronic, fair control. Goal BP <130/80.  EKG performed, NSR w/ RBBB. Blood pressure improved with amlodipine .- Continue amlodipine  2.5 mg at dinner. - Follow low sodium diet.  - Follow up in six months.

## 2024-10-05 NOTE — Assessment & Plan Note (Signed)
 Exam suggestive of seborrheic keratosis. Will refer her to Derm for further evaluation as requested.

## 2024-10-05 NOTE — Assessment & Plan Note (Signed)

## 2024-10-06 ENCOUNTER — Telehealth: Payer: Self-pay | Admitting: Neurology

## 2024-10-06 LAB — HEMOGLOBIN A1C
Est. average glucose Bld gHb Est-mCnc: 120 mg/dL
Hgb A1c MFr Bld: 5.8 % — ABNORMAL HIGH (ref 4.8–5.6)

## 2024-10-06 LAB — LIPID PANEL
Chol/HDL Ratio: 4.1 ratio (ref 0.0–4.4)
Cholesterol, Total: 239 mg/dL — ABNORMAL HIGH (ref 100–199)
HDL: 58 mg/dL (ref >39)
LDL Chol Calc (NIH): 163 mg/dL — ABNORMAL HIGH (ref 0–99)
Triglycerides: 101 mg/dL (ref 0–149)
VLDL Cholesterol Cal: 18 mg/dL (ref 5–40)

## 2024-10-06 LAB — CBC
Hematocrit: 43.6 % (ref 34.0–46.6)
Hemoglobin: 14.5 g/dL (ref 11.1–15.9)
MCH: 30.7 pg (ref 26.6–33.0)
MCHC: 33.3 g/dL (ref 31.5–35.7)
MCV: 92 fL (ref 79–97)
Platelets: 290 x10E3/uL (ref 150–450)
RBC: 4.72 x10E6/uL (ref 3.77–5.28)
RDW: 13.2 % (ref 11.7–15.4)
WBC: 6.1 x10E3/uL (ref 3.4–10.8)

## 2024-10-06 NOTE — Telephone Encounter (Signed)
 Yes I'm not sure why an appt wasn't scheduled when she left her visit last year but Dr Buck advised a one year follow-up with NP. Please call her back and schedule her next available with any nurse practitioner. Tell her thank you for checking in!

## 2024-10-06 NOTE — Telephone Encounter (Signed)
 Pt called to request to speak to MD about if Pt need to be seen this year . Pt states she hasn't heard from MD in a year and wanted to make sure .

## 2024-10-08 NOTE — Telephone Encounter (Signed)
 Called PT , APPT  Scheduled  with N/P

## 2024-11-17 ENCOUNTER — Telehealth: Admitting: Neurology

## 2024-11-17 DIAGNOSIS — G4733 Obstructive sleep apnea (adult) (pediatric): Secondary | ICD-10-CM | POA: Diagnosis not present

## 2024-11-17 NOTE — Progress Notes (Signed)
 Patient: Sheryl [name] Date of Birth: 08/08/1966  Reason for Visit: Follow up History from: Patient Primary Neurologist: Buck  Virtual Visit via Video Note  I connected with Sheryl [name] on 11/17/24 at  9:45 AM EST by a video enabled telemedicine application and verified that I am speaking with the correct person using two identifiers.  Location: Patient: at a friend's home Provider: in the office    I discussed the limitations of evaluation and management by telemedicine and the availability of in person appointments. The patient expressed understanding and agreed to proceed.  ASSESSMENT AND PLAN 58 y.o. year old female   OSA on CPAP (set up Jan 2023, home sleep test from 11/29/2021 showed an AHI of 19.4/h, O2 nadir 85%)  -Doing excellent on CPAP.  She will bring her CPAP by the office in the next week or so so we can pull a download.  Indicates nightly usage with excellent benefit.  We checked with her DME on her machine does not have the ability to transmit data electronically.  We will follow-up for an in office visit in 1 year. I will addend the note with the data when we review.   Addendum 11/24/24 SS: CPAP report shows 93% compliance, > 4 hours 90%, 5-11 cm water, AHI 1.9, leak 2.1.   HISTORY OF PRESENT ILLNESS: Today 11/17/24 11/17/24 SS: Here for CPAP via VV. Doing great with CPAP. Uses most nights, if she has short trips she may not take it. CPAP is life changing for her. More energy, well rested at night. Needs new supplies, uses mask under the nose over the mouth. Health has been good. Issues with knees and back. She loves her CPAP. No longer wakes with headache or feeling exhausted with CPAP.  Her CPAP does not allow for electronic transmission of data.  She will bring it by the office in the next week or so.  HISTORY Ms. Kalis is a 58 year old right-handed woman with an underlying medical history of reflux disease, urinary frequency, uterine fibroids, hip  pain, and overweight state, who presents for follow-up consultation of her obstructive sleep apnea, on AutoPap therapy.  The patient is unaccompanied today and presents for her one year check up. I last saw her on 05/01/2022, at which time she was compliant with treatment and doing well with her AutoPap.  I provided her with a sample mask but she called back reporting that it was not compatible with her tubing.  She was advised to take the mask and tubing to her DME provider for additional help. She had significant improvement in her daytime energy and headaches.  She was advised to follow-up routinely in 1 year.  We had to cancel an interim appointment unfortunately.   Dr. Buck 08/28/2023: I reviewed her AutoPap compliance data from 07/29/2023 through 08/27/2023, which is a total of 30 days, during which time she used her machine 21 days with percent use days greater than 4 hours at 70%, indicating adequate compliance, average usage of 6 hours and 35 minutes for days on treatment, average AHI 2.4/h, at goal, average pressure for the 95th percentile at 10.3 cm with a range of 5 to 11 cm with EPR of 3.  Leak on the lower side with the 95th percentile at 2.5 L/min.  She reports doing well with her machine, she does need new supplies.  She uses the nasal cushion interface which is very tolerable but lately she has noticed some pinching on the sides of her nose and  discoloration, she has been using a Band-Aid over the area and it has worked out okay.  She is very motivated to continue with treatment.  She is working on better sleep habits and overall health, is currently in counseling as well.  She recently started trazodone  at night to help her sleep, low-dose, 50 mg strength.  She currently lives in [place].  Her Epworth sleepiness score is 6 out of 24.  REVIEW OF SYSTEMS: Out of a complete 14 system review of symptoms, the patient complains only of the following symptoms, and all other reviewed systems are  negative.  See HPI  ALLERGIES: Allergies  Allergen Reactions   Almond (Diagnostic) Anaphylaxis   Cat Dander    Milk-Related Compounds     HOME MEDICATIONS: Outpatient Medications Prior to Visit  Medication Sig Dispense Refill   amLODipine  (NORVASC ) 2.5 MG tablet Take 1 tablet (2.5 mg total) by mouth daily. 30 tablet 11   Ascorbic Acid (VITAMIN C PO) Take by mouth.     benzonatate  (TESSALON  PERLES) 100 MG capsule Take 1 capsule (100 mg total) by mouth 3 (three) times daily as needed for cough. 30 capsule 1   Biotin w/ Vitamins C & E (HAIR/SKIN/NAILS PO) Take by mouth.     Calcium Carbonate (CALCIUM 600 PO) Take by mouth. 1 daily     Cholecalciferol (VITAMIN D3 PO) 5,000 Units daily.      Cyanocobalamin (B-12 PO) Take by mouth.     diclofenac  Sodium (VOLTAREN ) 1 % GEL Apply to affected area two to three times daily as needed 150 g 1   EPINEPHrine  (EPIPEN  2-PAK) 0.3 mg/0.3 mL IJ SOAJ injection EpiPen  2-Pak 0.3 mg/0.3 mL injection, auto-injector 1 each 2   fluticasone  (FLONASE ) 50 MCG/ACT nasal spray 2 sprays via each nostril daily 48 g 2   ibuprofen  (ADVIL ,MOTRIN ) 600 MG tablet 1 po pc q 6 hours x 5 days then prn-pain 30 tablet 1   levocetirizine (XYZAL ) 5 MG tablet TAKE 1 TABLET BY MOUTH EVERY DAY IN THE EVENING 90 tablet 1   montelukast  (SINGULAIR ) 10 MG tablet TAKE 1 TABLET BY MOUTH EVERY DAY 90 tablet 1   Multiple Vitamins-Minerals (MULTIVITAMIN ADULTS PO) multivitamin     Probiotic Product (RESTORA) CAPS TAKE 1 CAPSULE BY MOUTH EVERY DAY 30 capsule 5   ursodiol (ACTIGALL) 300 MG capsule Take 600 mg by mouth.     No facility-administered medications prior to visit.    PAST MEDICAL HISTORY: Past Medical History:  Diagnosis Date   Anxiety    is in therapy   Dysmenorrhea    Endocervical polyp    Fibroids    GERD (gastroesophageal reflux disease)    diet controlled   H/O varicella    History of chicken pox    History of measles    History of measles, mumps, or rubella     History of mumps    Menorrhagia    Nocturia    Unstable bladder    Urinary frequency    Yeast infection     PAST SURGICAL HISTORY: Past Surgical History:  Procedure Laterality Date   MYOMECTOMY N/A 10/28/2013   Procedure: MYOMECTOMY;  Surgeon: Rome Rigg, MD;  Location: [facility];  Service: Gynecology;  Laterality: N/A;    FAMILY HISTORY: Family History  Problem Relation Age of Onset   Diverticulitis Mother    Diabetes Father    Diabetes Paternal Grandfather    Sleep apnea Neg Hx     SOCIAL HISTORY: Social History  Socioeconomic History   Marital status: Single    Spouse name: Not on file   Number of children: Not on file   Years of education: Not on file   Highest education level: Master's degree (e.g., MA, MS, MEng, MEd, MSW, MBA)  Occupational History   Not on file  Tobacco Use   Smoking status: Never   Smokeless tobacco: Never  Vaping Use   Vaping status: Never Used  Substance and Sexual Activity   Alcohol use: Yes    Comment: 3 glasses of wine montly   Drug use: No   Sexual activity: Not on file  Other Topics Concern   Not on file  Social History Narrative   Caffeine: usually 1 cup coffee in the mornings   Social Drivers of Health   Financial Resource Strain: Low Risk  (04/14/2024)   Overall Financial Resource Strain (CARDIA)    Difficulty of Paying Living Expenses: Not hard at all  Food Insecurity: Low Risk  (09/28/2024)   Received from [facility]   Hunger Vital Sign    Within the past 12 months, you worried that your food would run out before you got money to buy more: Never true    Within the past 12 months, the food you bought just didn't last and you didn't have money to get more. : Never true  Transportation Needs: No Transportation Needs (09/28/2024)   Received from [facility]    In the past 12 months, has lack of reliable transportation kept you from medical appointments, meetings, work or from getting things needed  for daily living? : No  Physical Activity: Insufficiently Active (04/14/2024)   Exercise Vital Sign    Days of Exercise per Week: 1 day    Minutes of Exercise per Session: 20 min  Stress: No Stress Concern Present (04/14/2024)   [employer] of Occupational Health - Occupational Stress Questionnaire    Feeling of Stress : Not at all  Social Connections: Moderately Integrated (04/14/2024)   Social Connection and Isolation Panel    Frequency of Communication with Friends and Family: More than three times a week    Frequency of Social Gatherings with Friends and Family: Once a week    Attends Religious Services: More than 4 times per year    Active Member of [employer] or Organizations: Yes    Attends Banker Meetings: More than 4 times per year    Marital Status: Never married  Intimate Partner Violence: Low Risk (04/01/2020)   Received from [facility]   Intimate Partner Violence    Insults You: Not on file    Threatens You: Not on file    Screams at You: Not on file    Physically Hurt: Not on file    Intimate Partner Violence Score: Not on file   PHYSICAL EXAM  There were no vitals filed for this visit. There is no height or weight on file to calculate BMI.  Virtual Visit  DIAGNOSTIC DATA (LABS, IMAGING, TESTING) - I reviewed patient records, labs, notes, testing and imaging myself where available.  Lab Results  Component Value Date   WBC 6.1 10/05/2024   HGB 14.5 10/05/2024   HCT 43.6 10/05/2024   MCV 92 10/05/2024   PLT 290 10/05/2024      Component Value Date/Time   NA 144 03/09/2024 0940   K 4.2 03/09/2024 0940   CL 102 03/09/2024 0940   CO2 25 03/09/2024 0940   GLUCOSE 98 03/09/2024  0940   GLUCOSE 85 10/14/2013 1405   BUN 10 03/09/2024 0940   CREATININE 0.75 03/09/2024 0940   CALCIUM 9.6 03/09/2024 0940   PROT 7.1 03/09/2024 0940   ALBUMIN 4.4 03/09/2024 0940   AST 41 (H) 03/09/2024 0940   ALT 54 (H) 03/09/2024 0940   ALKPHOS 219 (H)  03/09/2024 0940   BILITOT 0.7 03/09/2024 0940   GFRNONAA 89 04/26/2020 1041   GFRAA 103 04/26/2020 1041   Lab Results  Component Value Date   CHOL 239 (H) 10/05/2024   HDL 58 10/05/2024   LDLCALC 163 (H) 10/05/2024   TRIG 101 10/05/2024   CHOLHDL 4.1 10/05/2024   Lab Results  Component Value Date   HGBA1C 5.8 (H) 10/05/2024   Lab Results  Component Value Date   VITAMINB12 831 05/02/2021   Lab Results  Component Value Date   TSH 1.42 04/10/2023   Lauraine Born, AGNP-C, DNP 11/17/2024, 5:22 AM [facility] [address], [address] [place], [place] [address] [phone]

## 2024-11-17 NOTE — Telephone Encounter (Signed)
 Called and spoke to DME: [facility] [phone] Fax:[phone]  They will be faxing report over. And working on linking

## 2024-11-17 NOTE — Telephone Encounter (Signed)
 Spoke to [facility] and they stated that this pt is card download only and will have to come to the office and was purchased in 2023. So she will need to come by the office to bring machine

## 2024-11-17 NOTE — Patient Instructions (Signed)
 Please bring your CPAP machine by so we can pull the data to review. Otherwise, keep using nightly, minimum 4 hours, replace supplies routinely through DME. Thanks!!

## 2024-11-23 ENCOUNTER — Encounter: Payer: Self-pay | Admitting: Neurology

## 2024-11-24 NOTE — Telephone Encounter (Signed)
 Sheryl [name]. Here is the note you needed:

## 2024-12-08 ENCOUNTER — Encounter: Payer: Self-pay | Admitting: Internal Medicine

## 2024-12-08 ENCOUNTER — Other Ambulatory Visit: Payer: Self-pay

## 2024-12-12 ENCOUNTER — Other Ambulatory Visit: Payer: Self-pay | Admitting: Internal Medicine

## 2024-12-29 ENCOUNTER — Ambulatory Visit: Admitting: Internal Medicine

## 2025-01-07 ENCOUNTER — Ambulatory Visit: Payer: Self-pay | Admitting: Internal Medicine

## 2025-01-14 ENCOUNTER — Ambulatory Visit: Admitting: Internal Medicine

## 2025-01-14 ENCOUNTER — Encounter: Payer: Self-pay | Admitting: Internal Medicine

## 2025-01-14 VITALS — BP 114/70 | HR 66 | Temp 98.3°F | Ht 66.0 in | Wt 177.0 lb

## 2025-01-14 DIAGNOSIS — Z6828 Body mass index (BMI) 28.0-28.9, adult: Secondary | ICD-10-CM

## 2025-01-14 DIAGNOSIS — I10 Essential (primary) hypertension: Secondary | ICD-10-CM

## 2025-01-14 DIAGNOSIS — R7309 Other abnormal glucose: Secondary | ICD-10-CM

## 2025-01-14 DIAGNOSIS — E663 Overweight: Secondary | ICD-10-CM | POA: Diagnosis not present

## 2025-01-14 NOTE — Progress Notes (Signed)
 I,Sheryl [name], CMA,acting as a neurosurgeon for Sheryl LOISE Slocumb, MD.,have documented all relevant documentation on the behalf of Sheryl LOISE Slocumb, MD,as directed by  Sheryl LOISE Slocumb, MD while in the presence of Sheryl LOISE Slocumb, MD.  Subjective:  Patient ID: Sheryl [name] , female    DOB: 1966-10-20 , 59 y.o.   MRN: [id number]  Chief Complaint  Patient presents with   Hypertension    Patient presents today for a bpc. Patient reports compliance with her meds patient denies having any chest pain,sob or headaches at this time.    HPI Discussed the use of AI scribe software for clinical note transcription with the patient, who gave verbal consent to proceed.  History of Present Illness Sheryl [name] is a 59 year old female who presents for a blood pressure check and follow-up on hair treatment.  She has been exercising regularly and feels great, although she is surprised by her weight despite working out and trying to eat clean. She notes losing inches, particularly noticing that her sweats are looser.  She discusses her hair treatment, mentioning that she has been prescribed two topical medications. She is using a clobetasol solution prescribed in November and has not yet used the compounded medication due to concerns about liver issues, as the label advises consulting a doctor if there are liver concerns. She has been using the non-compounded medication regularly and needs a refill.  She is focused on her diet, cutting back on sodas and desserts, and is eating clean with a focus on vegetables, fruits, and proteins. She is trying to find alternatives to processed meats for breakfast and is incorporating more vegetables into her meals. She mentions using ground turkey and coleslaw mix for easy dinners and is exploring different ways to prepare chicken and turkey.  She mentions a past issue with anxiety and depression, for which she was taken out of work for six months. She is  trying to obtain the notes used to make this decision as her claim was denied due to incorrect forms being sent.  She acknowledges she was seen by a different provider for this issue.z   Hypertension This is a new problem. The current episode started more than 1 month ago. The problem has been gradually improving since onset. Pertinent negatives include no chest pain, headaches or palpitations. Past treatments include calcium channel blockers. The current treatment provides moderate improvement.     Past Medical History:  Diagnosis Date   Anxiety    is in therapy   Dysmenorrhea    Endocervical polyp    Fibroids    GERD (gastroesophageal reflux disease)    diet controlled   H/O varicella    History of chicken pox    History of measles    History of measles, mumps, or rubella    History of mumps    Menorrhagia    Nocturia    Unstable bladder    Urinary frequency    Yeast infection      Family History  Problem Relation Age of Onset   Diverticulitis Mother    Diabetes Father    Diabetes Paternal Grandfather    Sleep apnea Neg Hx      Current Outpatient Medications:    amLODipine  (NORVASC ) 2.5 MG tablet, Take 1 tablet (2.5 mg total) by mouth daily., Disp: 30 tablet, Rfl: 11   Ascorbic Acid (VITAMIN C PO), Take by mouth., Disp: , Rfl:    benzonatate  (TESSALON  PERLES) 100 MG capsule, Take 1  capsule (100 mg total) by mouth 3 (three) times daily as needed for cough., Disp: 30 capsule, Rfl: 1   Biotin w/ Vitamins C & E (HAIR/SKIN/NAILS PO), Take by mouth., Disp: , Rfl:    Calcium Carbonate (CALCIUM 600 PO), Take by mouth. 1 daily, Disp: , Rfl:    Cholecalciferol (VITAMIN D3 PO), 5,000 Units daily. , Disp: , Rfl:    clobetasol (TEMOVATE) 0.05 % external solution, Apply topically 2 (two) times daily., Disp: , Rfl:    Cyanocobalamin (B-12 PO), Take by mouth., Disp: , Rfl:    EPINEPHrine  (EPIPEN  2-PAK) 0.3 mg/0.3 mL IJ SOAJ injection, EpiPen  2-Pak 0.3 mg/0.3 mL injection,  auto-injector, Disp: 1 each, Rfl: 2   Finasteride-Minoxidil 0.1-7 % SOLN, Apply topically., Disp: , Rfl:    fluticasone  (FLONASE ) 50 MCG/ACT nasal spray, 2 sprays via each nostril daily, Disp: 48 g, Rfl: 2   ibuprofen  (ADVIL ,MOTRIN ) 600 MG tablet, 1 po pc q 6 hours x 5 days then prn-pain, Disp: 30 tablet, Rfl: 1   levocetirizine (XYZAL ) 5 MG tablet, TAKE 1 TABLET BY MOUTH EVERY DAY IN THE EVENING, Disp: 90 tablet, Rfl: 1   montelukast  (SINGULAIR ) 10 MG tablet, TAKE 1 TABLET BY MOUTH EVERY DAY, Disp: 90 tablet, Rfl: 1   Multiple Vitamins-Minerals (MULTIVITAMIN ADULTS PO), multivitamin, Disp: , Rfl:    Probiotic Product (RESTORA) CAPS, TAKE 1 CAPSULE BY MOUTH EVERY DAY, Disp: 30 capsule, Rfl: 5   ursodiol (ACTIGALL) 300 MG capsule, Take 600 mg by mouth., Disp: , Rfl:    diclofenac  Sodium (VOLTAREN ) 1 % GEL, Apply to affected area two to three times daily as needed (Patient not taking: Reported on 01/14/2025), Disp: 150 g, Rfl: 1   Allergies  Allergen Reactions   Almond (Diagnostic) Anaphylaxis   Cat Dander    Milk-Related Compounds      Review of Systems  Constitutional: Negative.   Eyes: Negative.   Respiratory: Negative.    Cardiovascular: Negative.  Negative for chest pain and palpitations.  Gastrointestinal: Negative.   Musculoskeletal: Negative.   Skin: Negative.   Neurological:  Negative for headaches.  Psychiatric/Behavioral: Negative.       Today's Vitals   01/14/25 0957  BP: 114/70  Pulse: 66  Temp: 98.3 F (36.8 C)  TempSrc: Oral  Weight: 177 lb (80.3 kg)  Height: 5' 6 (1.676 m)  PainSc: 0-No pain   Body mass index is 28.57 kg/m.  Wt Readings from Last 3 Encounters:  01/14/25 177 lb (80.3 kg)  10/05/24 180 lb (81.6 kg)  09/03/24 178 lb (80.7 kg)    The 10-year ASCVD risk score (Arnett DK, et al., 2019) is: 4.8%   Values used to calculate the score:     Age: 59 years     Clinically relevant sex: Female     Is Non-Hispanic African American: Yes     Diabetic:  No     Tobacco smoker: No     Systolic Blood Pressure: 114 mmHg     Is BP treated: Yes     HDL Cholesterol: 58 mg/dL     Total Cholesterol: 239 mg/dL  Objective:  Physical Exam Vitals and nursing note reviewed.  Constitutional:      Appearance: Normal appearance.  HENT:     Head: Normocephalic and atraumatic.  Eyes:     Extraocular Movements: Extraocular movements intact.  Cardiovascular:     Rate and Rhythm: Normal rate and regular rhythm.     Heart sounds: Normal heart sounds.  Pulmonary:  Effort: Pulmonary effort is normal.     Breath sounds: Normal breath sounds.  Musculoskeletal:     Cervical back: Normal range of motion.  Skin:    General: Skin is warm.  Neurological:     General: No focal deficit present.     Mental Status: She is alert.  Psychiatric:        Mood and Affect: Mood normal.        Behavior: Behavior normal.         Assessment And Plan:   Assessment & Plan Essential hypertension, benign Chronic, well controlled.  Goal BP <130/80.  Blood pressure improved with amlodipine .- Continue amlodipine  2.5 mg at dinner. - Follow low sodium diet.  - Follow up in six months.  Abnormal glucose Previous labs reviewed, her A1c has been elevated in the past. I will check an A1c today. Reminded to avoid refined sugars including sugary drinks/foods and processed meats including bacon, sausages and deli meats.   Overweight with body mass index (BMI) of 28 to 28.9 in adult She was congratulated on her 3lb weight loss since October 2025.  - Aim for at least 150 minutes of exercise per week - Keep up your efforts!  - Continue exercise regimen and dietary modifications. - Encouraged further reduction of processed foods and increase in vegetable intake. General Health Maintenance Overdue for second shingles vaccine; discussed importance of timely administration. - Administer second shingles vaccine at pharmacy, per her request. - Send message to provider upon  receiving vaccine.   Orders Placed This Encounter  Procedures   Hemoglobin A1c   CMP14+EGFR   Return in about 4 months (around 05/14/2025) for move April appt to end of May.  Patient was given opportunity to ask questions. Patient verbalized understanding of the plan and was able to repeat key elements of the plan. All questions were answered to their satisfaction.    I, Sheryl LOISE Slocumb, MD, have reviewed all documentation for this visit. The documentation on 01/14/25 for the exam, diagnosis, procedures, and orders are all accurate and complete.   IF YOU HAVE BEEN REFERRED TO A SPECIALIST, IT MAY TAKE 1-2 WEEKS TO SCHEDULE/PROCESS THE REFERRAL. IF YOU HAVE NOT HEARD FROM US /SPECIALIST IN TWO WEEKS, PLEASE GIVE US  A CALL AT [phone].

## 2025-01-14 NOTE — Assessment & Plan Note (Addendum)
 Previous labs reviewed, her A1c has been elevated in the past. I will check an A1c today. Reminded to avoid refined sugars including sugary drinks/foods and processed meats including bacon, sausages and deli meats.

## 2025-01-14 NOTE — Patient Instructions (Addendum)
 Ground turkey/cole slaw mix Big mac salad  Hypertension, Adult Hypertension is another name for high blood pressure. High blood pressure forces your heart to work harder to pump blood. This can cause problems over time. There are two numbers in a blood pressure reading. There is a top number (systolic) over a bottom number (diastolic). It is best to have a blood pressure that is below 120/80. What are the causes? The cause of this condition is not known. Some other conditions can lead to high blood pressure. What increases the risk? Some lifestyle factors can make you more likely to develop high blood pressure: Smoking. Not getting enough exercise or physical activity. Being overweight. Having too much fat, sugar, calories, or salt (sodium) in your diet. Drinking too much alcohol. Other risk factors include: Having any of these conditions: Heart disease. Diabetes. High cholesterol. Kidney disease. Obstructive sleep apnea. Having a family history of high blood pressure and high cholesterol. Age. The risk increases with age. Stress. What are the signs or symptoms? High blood pressure may not cause symptoms. Very high blood pressure (hypertensive crisis) may cause: Headache. Fast or uneven heartbeats (palpitations). Shortness of breath. Nosebleed. Vomiting or feeling like you may vomit (nauseous). Changes in how you see. Very bad chest pain. Feeling dizzy. Seizures. How is this treated? This condition is treated by making healthy lifestyle changes, such as: Eating healthy foods. Exercising more. Drinking less alcohol. Your doctor may prescribe medicine if lifestyle changes do not help enough and if: Your top number is above 130. Your bottom number is above 80. Your personal target blood pressure may vary. Follow these instructions at home: Eating and drinking  If told, follow the DASH eating plan. To follow this plan: Fill one half of your plate at each meal with fruits  and vegetables. Fill one fourth of your plate at each meal with whole grains. Whole grains include whole-wheat pasta, brown rice, and whole-grain bread. Eat or drink low-fat dairy products, such as skim milk or low-fat yogurt. Fill one fourth of your plate at each meal with low-fat (lean) proteins. Low-fat proteins include fish, chicken without skin, eggs, beans, and tofu. Avoid fatty meat, cured and processed meat, or chicken with skin. Avoid pre-made or processed food. Limit the amount of salt in your diet to less than 1,500 mg each day. Do not drink alcohol if: Your doctor tells you not to drink. You are pregnant, may be pregnant, or are planning to become pregnant. If you drink alcohol: Limit how much you have to: 0-1 drink a day for women. 0-2 drinks a day for men. Know how much alcohol is in your drink. In the [place] one drink equals one 12 oz bottle of beer (355 mL), one 5 oz glass of wine (148 mL), or one 1 oz glass of hard liquor (44 mL). Lifestyle  Work with your doctor to stay at a healthy weight or to lose weight. Ask your doctor what the best weight is for you. Get at least 30 minutes of exercise that causes your heart to beat faster (aerobic exercise) most days of the week. This may include walking, swimming, or biking. Get at least 30 minutes of exercise that strengthens your muscles (resistance exercise) at least 3 days a week. This may include lifting weights or doing Pilates. Do not smoke or use any products that contain nicotine or tobacco. If you need help quitting, ask your doctor. Check your blood pressure at home as told by your doctor. Keep all  follow-up visits. Medicines Take over-the-counter and prescription medicines only as told by your doctor. Follow directions carefully. Do not skip doses of blood pressure medicine. The medicine does not work as well if you skip doses. Skipping doses also puts you at risk for problems. Ask your doctor about side effects or  reactions to medicines that you should watch for. Contact a doctor if: You think you are having a reaction to the medicine you are taking. You have headaches that keep coming back. You feel dizzy. You have swelling in your ankles. You have trouble with your vision. Get help right away if: You get a very bad headache. You start to feel mixed up (confused). You feel weak or numb. You feel faint. You have very bad pain in your: Chest. Belly (abdomen). You vomit more than once. You have trouble breathing. These symptoms may be an emergency. Get help right away. Call 911. Do not wait to see if the symptoms will go away. Do not drive yourself to the hospital. Summary Hypertension is another name for high blood pressure. High blood pressure forces your heart to work harder to pump blood. For most people, a normal blood pressure is less than 120/80. Making healthy choices can help lower blood pressure. If your blood pressure does not get lower with healthy choices, you may need to take medicine. This information is not intended to replace advice given to you by your health care provider. Make sure you discuss any questions you have with your health care provider. Document Revised: 09/21/2021 Document Reviewed: 09/21/2021 Elsevier Patient Education  2024 [employer].

## 2025-01-14 NOTE — Assessment & Plan Note (Addendum)
 Chronic, well controlled.  Goal BP <130/80.  Blood pressure improved with amlodipine .- Continue amlodipine  2.5 mg at dinner. - Follow low sodium diet.  - Follow up in six months.

## 2025-01-15 LAB — CMP14+EGFR
ALT: 25 [IU]/L (ref 0–32)
AST: 22 [IU]/L (ref 0–40)
Albumin: 4.4 g/dL (ref 3.8–4.9)
Alkaline Phosphatase: 201 [IU]/L — ABNORMAL HIGH (ref 49–135)
BUN/Creatinine Ratio: 15 (ref 9–23)
BUN: 11 mg/dL (ref 6–24)
Bilirubin Total: 0.7 mg/dL (ref 0.0–1.2)
CO2: 26 mmol/L (ref 20–29)
Calcium: 9.6 mg/dL (ref 8.7–10.2)
Chloride: 103 mmol/L (ref 96–106)
Creatinine, Ser: 0.73 mg/dL (ref 0.57–1.00)
Globulin, Total: 2.7 g/dL (ref 1.5–4.5)
Glucose: 93 mg/dL (ref 70–99)
Potassium: 4.5 mmol/L (ref 3.5–5.2)
Sodium: 143 mmol/L (ref 134–144)
Total Protein: 7.1 g/dL (ref 6.0–8.5)
eGFR: 95 mL/min/{1.73_m2}

## 2025-01-15 LAB — HEMOGLOBIN A1C
Est. average glucose Bld gHb Est-mCnc: 117 mg/dL
Hgb A1c MFr Bld: 5.7 % — ABNORMAL HIGH (ref 4.8–5.6)

## 2025-01-16 ENCOUNTER — Ambulatory Visit: Payer: Self-pay | Admitting: Internal Medicine

## 2025-04-05 ENCOUNTER — Ambulatory Visit: Payer: Self-pay | Admitting: Internal Medicine

## 2025-05-13 ENCOUNTER — Ambulatory Visit: Admitting: Internal Medicine

## 2025-10-13 ENCOUNTER — Encounter: Payer: Self-pay | Admitting: Internal Medicine

## 2025-11-18 ENCOUNTER — Ambulatory Visit: Admitting: Neurology
